# Patient Record
Sex: Female | Born: 1995 | Hispanic: No | Marital: Single | Smoking: Former smoker
Health system: Southern US, Community
[De-identification: ages and names within clinical notes are randomized; demographics above are authoritative.]

## PROBLEM LIST (undated history)

## (undated) DIAGNOSIS — IMO0002 Reserved for concepts with insufficient information to code with codable children: Secondary | ICD-10-CM

## (undated) DIAGNOSIS — F3181 Bipolar II disorder: Secondary | ICD-10-CM

## (undated) DIAGNOSIS — F4310 Post-traumatic stress disorder, unspecified: Secondary | ICD-10-CM

## (undated) DIAGNOSIS — M329 Systemic lupus erythematosus, unspecified: Secondary | ICD-10-CM

## (undated) HISTORY — PX: SKIN BIOPSY: SHX1

---
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
## 2016-01-01 DIAGNOSIS — F3181 Bipolar II disorder: Principal | ICD-10-CM

## 2016-01-01 DIAGNOSIS — Z79899 Other long term (current) drug therapy: Secondary | ICD-10-CM

## 2016-01-01 MED ORDER — ENSURE ENLIVE PO LIQD
237.0000 mL | Freq: Two times a day (BID) | ORAL | Status: DC
  Administered 2016-01-01 – 2016-01-02 (×2): 237 mL via ORAL

## 2016-01-01 NOTE — BHH Suicide Risk Assessment (Addendum)
[facility]   Nursing information obtained from:  Patient Demographic factors:  Adolescent or young adult Current Mental Status:  NA Loss Factors:  NA Historical Factors:  Prior suicide attempts, Impulsivity Risk Reduction Factors:  NA  Total Time spent with patient: 1 hour Principal Problem: <principal problem not specified> Diagnosis:   Patient Active Problem List   Diagnosis Date Noted  . Major depressive disorder, recurrent severe without psychotic features (HCC) [F33.2] 12/31/2015  . Bipolar 2 disorder (HCC) [F31.81] 12/31/2015   Subjective Data: 20 year old female ,a student of [employer] admitted here for suicide ideation and attempt with a lot of  Lithium prescribed for her Bipolar Disorder. Freshman for 2 weeks but not taking the lithium. 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 She now denies it was a suicide attempt. Patient got very sick after the overdose and called her uncle who took her to the Ed. sHE HAS LOST 20pounds in a month. Urine is positive for THC  Continued Clinical Symptoms:  Alcohol Use Disorder Identification Test Final Score (AUDIT): 0 The "Alcohol Use Disorders Identification Test", Guidelines for Use in Primary Care, Second Edition.  World Science writerHealth Organization [facility](WHO). Score between 0-7:  no or low risk or alcohol related problems. Score between 8-15:  moderate risk of alcohol related problems. Score between 16-19:  high risk of alcohol related problems. Score 20 or above:  warrants further diagnostic evaluation for alcohol dependence and treatment.   CLINICAL FACTORS:   Depression:   Anhedonia   Musculoskeletal: Strength & Muscle Tone: within normal limits Gait & Station: normal Patient leans: N/A  Psychiatric Specialty Exam: Physical Exam  ROS  Blood pressure 114/76, pulse (!) 107, temperature 98.4  F (36.9 C), temperature source Oral, resp. rate 18, height 5\' 2"  (1.575 m), weight 46.3 kg (102 lb), last menstrual period 12/23/2015, SpO2 100 %.Body mass index is 18.66 kg/m.  General Appearance: Casual  Eye Contact:  Good  Speech:  Clear and Coherent and Normal Rate  Volume:  Normal  Mood:  Depressed  Affect:  Appropriate  Thought Process:  Coherent  Orientation:  Full (Time, Place, and Person)  Thought Content:  Logical  Suicidal Thoughts:  No  Homicidal Thoughts:  No  Memory:  Immediate;   Good Recent;   Good Remote;   Good  Judgement:  Impaired  Insight:  Lacking  Psychomotor Activity:  Normal  Concentration:  Concentration: Fair and Attention Span: Fair  Recall:  Good  Fund of Knowledge:  Good  Language:  Good  Akathisia:  No  Handed:  Right  AIMS (if indicated):     Assets:  Communication Skills Desire for Improvement Housing Social Support Vocational/Educational  ADL's:  Intact  Cognition:  WNL  Sleep:  Number of Hours: 5.25      COGNITIVE FEATURES THAT CONTRIBUTE TO RISK:  None    SUICIDE RISK:   Moderate:  Frequent suicidal ideation with limited intensity, and duration, some specificity in terms of plans, no associated intent, good self-control, limited dysphoria/symptomatology, some risk factors present, and identifiable protective factors, including available and accessible social support.   PLAN OF CARE: Obtain collateral information from Family member for treatment and transition palnning. Restart trileptal. Encourage patient to participate in milieu, individual and group therapies.  I certify that inpatient services furnished can reasonably be expected to improve the  patient's condition.  Wynelle Bourgeois, MD 01/01/2016, 1:30 PM

## 2016-01-01 NOTE — Plan of Care (Signed)
Problem: Coping: Goal: Ability to verbalize frustrations and anger appropriately will improve Outcome: Not Progressing Pt. states "I don't want to be here".

## 2016-01-01 NOTE — H&P (Signed)
Psychiatric Admission Assessment Adult  Patient Identification: Michelle Cordova MRN:  [id number] Date of Evaluation:  01/01/2016 Chief Complaint:  bipolar 2 ptsd Principal Diagnosis: <principal problem not specified> Diagnosis:   Patient Active Problem List   Diagnosis Date Noted  . Major depressive disorder, recurrent severe without psychotic features ([place]) [F33.2] 12/31/2015  . Bipolar 2 disorder [facility]) [F31.81] 12/31/2015   History of Present Illness:  Michelle Cordova is an 20 y.o. female that presents this date transported by [employer] after having some thoughts of self harm earlier this date with no plan. Patient stated she is "so overwhelmed with everything".  She states she was diagnosed with Bipolar disorder 2 years ago, prescribed Lithium, but she was non compliant.  She states that she used to be on Trileptal but it gave her skin breakouts and she stopped.   Patient states she has problems "with everything" and became so distraught this date she had thoughts of harming herself.  She went into [facility] thinking she was going to get meds and now she is here, against her will and she is demanding to be discharged,.  Her aunt is in the room and can vouch that she has not been taking meds but that she can be released to her care as she will take her to her outpatient psychiatrist in [place].  Discussed that monitoring would be necessary to monitor her safety and she will not be released today.    Patient admits to two prior attempts at self harm with the last one four months ago when patient reported attempting to overdose on her lithium. Patient was admitted for four days at [facility] in [place] and released denying follow up aftercare and discontinued her lithium regimen after two months from discharge.   Patient reports ongoing Cannabis use stating she smokes "once or twice a week but cannot recall her last use. Patient denies any other use of illicit substances. Patient is  time/place oriented and is pleasant as she interacted with this Probation officer. Patient is open to a voluntary admission and desires to restart medications although stated she does not want "lithium." Patient does have a history of past trauma but declines to discuss that incident.  She is a Ship broker at [employer].  Associated Signs/Symptoms: Depression Symptoms:  depressed mood, anxiety, (Hypo) Manic Symptoms:  Impulsivity, Irritable Mood, Labiality of Mood, Anxiety Symptoms:  Excessive Worry, Psychotic Symptoms:  NA PTSD Symptoms: NA Total Time spent with patient: 30 minutes  Past Psychiatric History: see HPI  Is the patient at risk to self? Yes.    Has the patient been a risk to self in the past 6 months? Yes.    Has the patient been a risk to self within the distant past? Yes.    Is the patient a risk to others? No.  Has the patient been a risk to others in the past 6 months? No.  Has the patient been a risk to others within the distant past? No.   Prior Inpatient Therapy:   Prior Outpatient Therapy:    Alcohol Screening: 1. How often do you have a drink containing alcohol?: Never 9. Have you or someone else been injured as a result of your drinking?: No 10. Has a relative or friend or a doctor or another health worker been concerned about your drinking or suggested you cut down?: No Alcohol Use Disorder Identification Test Final Score (AUDIT): 0 Brief Intervention: AUDIT score less than 7 or less-screening does not suggest unhealthy drinking-brief intervention  not indicated Substance Abuse History in the last 12 months:  Yes.   Consequences of Substance Abuse: crisis management Previous Psychotropic Medications: Yes  Psychological Evaluations: Yes  Past Medical History:  Past Medical History:  Diagnosis Date  . Bipolar 2 disorder (HCC)   . Lupus (HCC)   . PTSD (post-traumatic stress disorder)    History reviewed. No pertinent surgical history. Family History: History reviewed. No  pertinent family history. Family Psychiatric  History: see HPI Tobacco Screening: Have you used any form of tobacco in the last 30 days? (Cigarettes, Smokeless Tobacco, Cigars, and/or Pipes): No Social History:  History  Alcohol Use  . Yes    Comment: rarely but socially     History  Drug Use  . Types: Marijuana    Comment: occasionally    Additional Social History:      Pain Medications: See MAR Prescriptions: See MAR Over the Counter: See MAR History of alcohol / drug use?: Yes Longest period of sobriety (when/how long): pt can not recall Negative Consequences of Use: Work / School Withdrawal Symptoms: Other (Comment) (denies withdrawal symptoms) Name of Substance 1: Cannabis 1 - Age of First Use: 17 1 - Amount (size/oz): 1 gram 1 - Frequency: once or twice a week 1 - Duration: Last two years 1 - Last Use / Amount: pt cannot remember                  Allergies:  No Known Allergies Lab Results:  Results for orders placed or performed during the hospital encounter of 12/31/15 (from the past 48 hour(s))  Ethanol     Status: None   Collection Time: 12/31/15  1:48 PM  Result Value Ref Range   Alcohol, Ethyl (B) <5 <5 mg/dL    Comment:        LOWEST DETECTABLE LIMIT FOR SERUM ALCOHOL IS 5 mg/dL FOR MEDICAL PURPOSES ONLY   Salicylate level     Status: None   Collection Time: 12/31/15  1:48 PM  Result Value Ref Range   Salicylate Lvl <4.0 2.8 - 30.0 mg/dL  Acetaminophen level     Status: Abnormal   Collection Time: 12/31/15  1:48 PM  Result Value Ref Range   Acetaminophen (Tylenol), Serum <10 (L) 10 - 30 ug/mL    Comment:        THERAPEUTIC CONCENTRATIONS VARY SIGNIFICANTLY. A RANGE OF 10-30 ug/mL MAY BE AN EFFECTIVE CONCENTRATION FOR MANY PATIENTS. HOWEVER, SOME ARE BEST TREATED AT CONCENTRATIONS OUTSIDE THIS RANGE. ACETAMINOPHEN CONCENTRATIONS >150 ug/mL AT 4 HOURS AFTER INGESTION AND >50 ug/mL AT 12 HOURS AFTER INGESTION ARE OFTEN ASSOCIATED WITH  TOXIC REACTIONS.   Comprehensive metabolic panel     Status: Abnormal   Collection Time: 12/31/15  1:51 PM  Result Value Ref Range   Sodium 137 135 - 145 mmol/L   Potassium 3.7 3.5 - 5.1 mmol/L   Chloride 106 101 - 111 mmol/L   CO2 25 22 - 32 mmol/L   Glucose, Bld 74 65 - 99 mg/dL   BUN 10 6 - 20 mg/dL   Creatinine, Ser 1.78 0.44 - 1.00 mg/dL   Calcium 9.3 8.9 - 67.7 mg/dL   Total Protein 7.4 6.5 - 8.1 g/dL   Albumin 4.4 3.5 - 5.0 g/dL   AST 18 15 - 41 U/L   ALT 22 14 - 54 U/L   Alkaline Phosphatase 39 38 - 126 U/L   Total Bilirubin 0.2 (L) 0.3 - 1.2 mg/dL   GFR calc non  Af Amer >60 >60 mL/min   GFR calc Af Amer >60 >60 mL/min    Comment: (NOTE) The eGFR has been calculated using the CKD EPI equation. This calculation has not been validated in all clinical situations. eGFR's persistently <60 mL/min signify possible Chronic Kidney Disease.    Anion gap 6 5 - 15  cbc     Status: Abnormal   Collection Time: 12/31/15  1:51 PM  Result Value Ref Range   WBC 5.9 4.0 - 10.5 K/uL   RBC 5.21 (H) 3.87 - 5.11 MIL/uL   Hemoglobin 13.6 12.0 - 15.0 g/dL   HCT 42.2 36.0 - 46.0 %   MCV 81.0 78.0 - 100.0 fL   MCH 26.1 26.0 - 34.0 pg   MCHC 32.2 30.0 - 36.0 g/dL   RDW 16.4 (H) 11.5 - 15.5 %   Platelets 316 150 - 400 K/uL  I-Stat beta hCG blood, ED     Status: None   Collection Time: 12/31/15  1:57 PM  Result Value Ref Range   I-stat hCG, quantitative <5.0 <5 mIU/mL   Comment 3            Comment:   GEST. AGE      CONC.  (mIU/mL)   <=1 WEEK        5 - 50     2 WEEKS       50 - 500     3 WEEKS       100 - 10,000     4 WEEKS     1,000 - 30,000        FEMALE AND NON-PREGNANT FEMALE:     LESS THAN 5 mIU/mL   Rapid urine drug screen (hospital performed)     Status: Abnormal   Collection Time: 12/31/15  2:34 PM  Result Value Ref Range   Opiates NONE DETECTED NONE DETECTED   Cocaine NONE DETECTED NONE DETECTED   Benzodiazepines NONE DETECTED NONE DETECTED   Amphetamines NONE DETECTED  NONE DETECTED   Tetrahydrocannabinol POSITIVE (A) NONE DETECTED   Barbiturates NONE DETECTED NONE DETECTED    Comment:        DRUG SCREEN FOR MEDICAL PURPOSES ONLY.  IF CONFIRMATION IS NEEDED FOR ANY PURPOSE, NOTIFY LAB WITHIN 5 DAYS.        LOWEST DETECTABLE LIMITS FOR URINE DRUG SCREEN Drug Class       Cutoff (ng/mL) Amphetamine      1000 Barbiturate      200 Benzodiazepine   790 Tricyclics       383 Opiates          300 Cocaine          300 THC              50     Blood Alcohol level:  Lab Results  Component Value Date   ETH <5 33/83/2919    Metabolic Disorder Labs:  No results found for: HGBA1C, MPG No results found for: PROLACTIN No results found for: CHOL, TRIG, HDL, CHOLHDL, VLDL, LDLCALC  Current Medications: Current Facility-Administered Medications  Medication Dose Route Frequency Provider Last Rate Last Dose  . acetaminophen (TYLENOL) tablet 650 mg  650 mg Oral Q6H PRN Laverle Hobby, PA-C      . alum & mag hydroxide-simeth (MAALOX/MYLANTA) 200-200-20 MG/5ML suspension 30 mL  30 mL Oral Q4H PRN Laverle Hobby, PA-C      . feeding supplement (ENSURE ENLIVE) (ENSURE ENLIVE) liquid 237 mL  237 mL  Oral BID BM Myer Peer Cobos, MD   237 mL at 01/01/16 0815  . hydrOXYzine (ATARAX/VISTARIL) tablet 25 mg  25 mg Oral Q6H PRN Laverle Hobby, PA-C      . risperiDONE (RISPERDAL M-TABS) disintegrating tablet 2 mg  2 mg Oral Q8H PRN Laverle Hobby, PA-C       And  . LORazepam (ATIVAN) tablet 1 mg  1 mg Oral PRN Laverle Hobby, PA-C       And  . ziprasidone (GEODON) injection 20 mg  20 mg Intramuscular PRN Laverle Hobby, PA-C      . magnesium hydroxide (MILK OF MAGNESIA) suspension 30 mL  30 mL Oral Daily PRN Laverle Hobby, PA-C      . traZODone (DESYREL) tablet 50 mg  50 mg Oral QHS,MR X 1 Spencer E Simon, PA-C       PTA Medications: Prescriptions Prior to Admission  Medication Sig Dispense Refill Last Dose  . lactose free nutrition (BOOST) LIQD Take 237 mLs  by mouth 2 (two) times daily.   12/31/2015 at Unknown time  . predniSONE (STERAPRED UNI-PAK 21 TAB) 10 MG (21) TBPK tablet See admin instructions.  0     Musculoskeletal: Strength & Muscle Tone: within normal limits Gait & Station: normal Patient leans: N/A  Psychiatric Specialty Exam: Physical Exam  Vitals reviewed. Psychiatric: Her affect is angry, blunt and labile. She is agitated. Thought content is not paranoid. She expresses impulsivity and inappropriate judgment. She expresses no homicidal and no suicidal ideation.    Review of Systems  Constitutional: Negative.   HENT: Negative.   Eyes: Negative.   Respiratory: Negative.   Cardiovascular: Negative.   Gastrointestinal: Negative.   Genitourinary: Negative.   Musculoskeletal: Negative.   Skin: Negative.   Neurological: Negative.   Endo/Heme/Allergies: Negative.   Psychiatric/Behavioral: The patient is nervous/anxious.     Blood pressure 114/76, pulse (!) 107, temperature 98.4 F (36.9 C), temperature source Oral, resp. rate 18, height _0  (1.575 m), weight 46.3 kg (102 lb), last menstrual period 12/23/2015, SpO2 100 %.Body mass index is 18.66 kg/m.  General Appearance: Neat  Eye Contact:  Fair  Speech:  Pressured  Volume:  Normal  Mood:  Angry, Anxious and Irritable  Affect:  Appropriate, Blunt and Labile  Thought Process:  Coherent  Orientation:  Full (Time, Place, and Person)  Thought Content:  Rumination  Suicidal Thoughts:  No  Homicidal Thoughts:  No  Memory:  Immediate;   Poor Recent;   Poor Remote;   Poor  Judgement:  Poor  Insight:  Lacking  Psychomotor Activity:  Normal  Concentration:  Concentration: Good and Attention Span: Good  Recall:  Good  Fund of Knowledge:  Good  Language:  Good  Akathisia:  Negative  Handed:  Right  AIMS (if indicated):     Assets:  Communication Skills Social Support  ADL's:  Intact  Cognition:  WNL  Sleep:  Number of Hours: 5.25   Treatment Plan Summary: Admit  for crisis management and mood stabilization. Medication management to re-stabilize current mood symptoms Group counseling sessions for coping skills Medical consults as needed Review and reinstate any pertinent home medications for other health problems  Observation Level/Precautions:  15 minute checks  Laboratory:  per ED  Psychotherapy:  group  Medications:  As per medlist  Consultations:  As needed  Discharge Concerns:  safety  Estimated LOS:  2- 7 days  Other:     I certify that inpatient services  furnished can reasonably be expected to improve the patient's condition.    [facility], NP [facility] 8/29/20174:00 PM
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
## 2016-01-01 NOTE — Plan of Care (Signed)
Problem: Activity: Goal: Interest or engagement in activities will improve Outcome: Not Progressing Patient is unwilling to participate in any group activities.  She remains isolative to room.

## 2016-01-01 NOTE — Plan of Care (Signed)
Problem: Medication: Goal: Compliance with prescribed medication regimen will improve Outcome: Not Progressing Pt is not open to taking medications at this time.  She [place] go [place] to [place] to consult about starting any new meds.

## 2016-01-01 NOTE — Progress Notes (Signed)
At the beginning of the shift, pt was visiting with her aunt from [place] who is her main support.  Writer spoke to the aunt who asked about pt's discharge.  Pt's aunt was informed that she would need to call in the morning after 9 am to speak with the pt's CSW regarding pt's discharge.  Pt continues to deny SI/HI/AVH.  Pt's aunt states that she has been through situations similar to this with her niece, and says that her niece is not suicidal at this time.  She says that she intends to take her niece back to [place] when she is discharged.  Pt has been pleasant and polite this evening.  She was given an Ensure per request this evening as she says hasn't been eating much of the hospital food.  Support and encouragement offered.  Discharge plans are in process.  Meds offered as ordered.   Pt refusing to take any meds.  Wants to consult her psychiatrist in [place] before starting any new meds.  Safety maintained with q15 minute checks.

## 2016-01-01 NOTE — Progress Notes (Signed)
Adult Psychoeducational Group Note  Date:  01/01/2016 Time:  9:50 PM  Group Topic/Focus:  Wrap-Up Group:   The focus of this group is to help patients review their daily goal of treatment and discuss progress on daily workbooks.   Participation Level:  Did Not Attend  Participation Quality:  Did not attend  Affect:  Did not attend  Cognitive:  Did not attend  Insight: None  Engagement in Group:  Did not attend  Modes of Intervention:  Did not attend  Additional Comments:  Patient did not attend wrap up group tonight.  Malani Lees L Kay Ricciuti 01/01/2016, 9:50 PM

## 2016-01-01 NOTE — Progress Notes (Signed)
Recreation Therapy Notes  Animal-Assisted Activity (AAA) Program Checklist/Progress Notes Patient Eligibility Criteria Checklist & Daily Group note for Rec TxIntervention  Date: 08.29.2017 Time: 2:45pm [address]: [address]   AAA/T Program Assumption of Risk Form signed by Patient/ or Parent Legal Guardian Yes  Patient is free of allergies or sever asthma Yes  Patient reports no fear of animals Yes  Patient reports no history of cruelty to animals Yes  Patient understands his/her participation is voluntary Yes  Behavioral Response: Did not attend.   Marykay Lexenise L Brita Jurgensen, LRT/CTRS  Laraina Sulton L 01/01/2016 3:04 PM

## 2016-01-01 NOTE — Progress Notes (Signed)
Patient ID: Michelle Cordova, female   DOB: [birth year], 20 y.o.   MRN: [id number] D: Patient is anxious and agitated.  She came up to medication window and stated, "I've got more important things to do.  I need to be out living my life."  Attempted to talk to patient about focusing on her treatment and she stated, "what if I just go around and pull all the fire levers, will the doors open?"  She then proceeded to ask for DSS's telephone number which was given. She stated, "I'm not talking about this DSS.  I need the one in [place].  Is Medicaid the same thing as social security?"  Patient refused to take anything for anxiety. She is focused on seeing the psychiatrist.  She denies any depressive symptoms and rates her anxiety as a 5. A: Continue to monitor medication management and MD orders.  Safety checks completed every 15 minutes per protocol.  Offer support and encouragement as needed. R: Patient is receptive to staff; her behavior is appropriate.

## 2016-01-01 NOTE — Tx Team (Signed)
Interdisciplinary Treatment and Diagnostic Plan Update  01/01/2016 Time of Session: 8:56 AM  Michelle Cordova MRN: [id number]  Principal Diagnosis: <principal problem not specified>  Secondary Diagnoses: Active Problems:   Bipolar 2 disorder (HCC)   Current Medications:  Current Facility-Administered Medications  Medication Dose Route Frequency Provider Last Rate Last Dose  . acetaminophen (TYLENOL) tablet 650 mg  650 mg Oral Q6H PRN Kerry HoughSpencer E Simon, PA-C      . alum & mag hydroxide-simeth (MAALOX/MYLANTA) 200-200-20 MG/5ML suspension 30 mL  30 mL Oral Q4H PRN Kerry HoughSpencer E Simon, PA-C      . feeding supplement (ENSURE ENLIVE) (ENSURE ENLIVE) liquid 237 mL  237 mL Oral BID BM Rockey SituFernando A Cobos, MD   237 mL at 01/01/16 0815  . hydrOXYzine (ATARAX/VISTARIL) tablet 25 mg  25 mg Oral Q6H PRN Kerry HoughSpencer E Simon, PA-C      . risperiDONE (RISPERDAL M-TABS) disintegrating tablet 2 mg  2 mg Oral Q8H PRN Kerry HoughSpencer E Simon, PA-C       And  . LORazepam (ATIVAN) tablet 1 mg  1 mg Oral PRN Kerry HoughSpencer E Simon, PA-C       And  . ziprasidone (GEODON) injection 20 mg  20 mg Intramuscular PRN Kerry HoughSpencer E Simon, PA-C      . magnesium hydroxide (MILK OF MAGNESIA) suspension 30 mL  30 mL Oral Daily PRN Kerry HoughSpencer E Simon, PA-C      . traZODone (DESYREL) tablet 50 mg  50 mg Oral QHS,MR X 1 Spencer E Simon, PA-C        PTA Medications: Prescriptions Prior to Admission  Medication Sig Dispense Refill Last Dose  . lactose free nutrition (BOOST) LIQD Take 237 mLs by mouth 2 (two) times daily.   12/31/2015 at Unknown time  . predniSONE (STERAPRED UNI-PAK 21 TAB) 10 MG (21) TBPK tablet See admin instructions.  0     Treatment Modalities: Medication Management, Group therapy, Case management,  1 to 1 session with clinician, Psychoeducation, Recreational therapy.   Physician Treatment Plan for Primary Diagnosis: <principal problem not specified> Long Term Goal(s): Improvement in symptoms so as ready for discharge  Short  Term Goals: Ability to disclose and discuss suicidal ideas, Ability to identify and develop effective coping behaviors will improve and Compliance with prescribed medications will improve  Medication Management: Evaluate patient's response, side effects, and tolerance of medication regimen.  Therapeutic Interventions: 1 to 1 sessions, Unit Group sessions and Medication administration.  Evaluation of Outcomes: Adequate for Discharge  Physician Treatment Plan for Secondary Diagnosis: Active Problems:   Bipolar 2 disorder (HCC)   Long Term Goal(s): Improvement in symptoms so as ready for discharge  Short Term Goals: Ability to disclose and discuss suicidal ideas, Ability to identify and develop effective coping behaviors will improve and Ability to identify triggers associated with substance abuse/mental health issues will improve  Medication Management: Evaluate patient's response, side effects, and tolerance of medication regimen.  Therapeutic Interventions: 1 to 1 sessions, Unit Group sessions and Medication administration.  Evaluation of Outcomes: Adequate for Discharge   RN Treatment Plan for Primary Diagnosis: <principal problem not specified> Long Term Goal(s): Knowledge of disease and therapeutic regimen to maintain health will improve  Short Term Goals: Ability to remain free from injury will improve, Ability to disclose and discuss suicidal ideas and Ability to identify and develop effective coping behaviors will improve  Medication Management: RN will administer medications as ordered by provider, will assess and evaluate patient's response and provide education to patient  for prescribed medication. RN will report any adverse and/or side effects to prescribing provider.  Therapeutic Interventions: 1 on 1 counseling sessions, Psychoeducation, Medication administration, Evaluate responses to treatment, Monitor vital signs and CBGs as ordered, Perform/monitor CIWA, COWS, AIMS and  Fall Risk screenings as ordered, Perform wound care treatments as ordered.  Evaluation of Outcomes: Adequate for Discharge   LCSW Treatment Plan for Primary Diagnosis: <principal problem not specified> Long Term Goal(s): Safe transition to appropriate next level of care at discharge, Engage patient in therapeutic group addressing interpersonal concerns.  Short Term Goals: Engage patient in aftercare planning with referrals and resources, Increase ability to appropriately verbalize feelings, Facilitate acceptance of mental health diagnosis and concerns and Increase skills for wellness and recovery  Therapeutic Interventions: Assess for all discharge needs, 1 to 1 time with Social worker, Explore available resources and support systems, Assess for adequacy in community support network, Educate family and significant other(s) on suicide prevention, Complete Psychosocial Assessment, Interpersonal group therapy.  Evaluation of Outcomes: Adequate for Discharge   Progress in Treatment: Attending groups: No Participating in groups: No Taking medication as prescribed: Yes, MD continues to assess for medication changes as needed Toleration medication: Yes, no side effects reported at this time Family/Significant other contact made: Yes with aunt Patient understands diagnosis: Limited insight AEB minimizing suicide attempt Discussing patient identified problems/goals with staff: Yes Medical problems stabilized or resolved: Yes Denies suicidal/homicidal ideation: Yes Issues/concerns per patient self-inventory: None Other: N/A  New problem(s) identified: None identified at this time.   New Short Term/Long Term Goal(s): None identified at this time.   Discharge Plan or Barriers: CSW will assess for appropriate discharge plan and relevant barriers.   Reason for Continuation of Hospitalization: Anxiety Depression Medication stabilization Suicidal ideation  Estimated Length of Stay: 0  days  Attendees: Patient: 01/01/2016  8:56 AM  Physician: Dr. Seth Bake 01/01/2016  8:56 AM  Nursing: Marzetta Board, RN; Liborio Nixon, RN 01/01/2016  8:56 AM  RN Care Manager: Onnie Boer, RN 01/01/2016  8:56 AM  Social Worker: Chad Cordial, LCSW; Belenda Cruise Drinkard, LCSW 01/01/2016  8:56 AM  Recreational Therapist:  01/01/2016  8:56 AM  Other: Armandina Stammer, NP; Gray Bernhardt, NP 01/01/2016  8:56 AM  Other:  01/01/2016  8:56 AM  Other: 01/01/2016  8:56 AM    Scribe for Treatment Team: Elaina Hoops, LCSW 01/01/2016 8:56 AM

## 2016-01-01 NOTE — Progress Notes (Signed)
D: Pt. couldn't state a goal except for " I don't want to be here".  Pt. is up and visible in the Mil[address] hallway. Denies having any SI/HI and AVH at this time. Rates pain as 0/10. Pt. is irritable and responds in short phrases, sometimes just yes or no.   A:Encouragement and support given. Pt. is educated on the importance of ordered Boost supplement and Trazodone but pt. refuses after multiple attempts.   R: Safety maintained with Q 15 checks. Continues to follow treatment plan and will monitor closely.    Signed by: Tyrone AppleEmily Tula Schryver, RN

## 2016-01-01 NOTE — Progress Notes (Signed)
Patient ID: Michelle Cordova, female   DOB: 1996/01/06, 20 y.o.   MRN: [id number] Patient becoming increasingly agitated and anxious.  She refuses to take medication.  Patient took one of the heavy chairs from the hallway and moved it to her room.  Patient placed the chair in front of her window.  [facility] and [facility] went to patient's room to talk with her. Patient made several comments that "I might as well just quit school.  I'm going to be here.  Can ya'll give me something to make me sleep for several days?  Can I go somewhere for a year?"  Patient indicated that she would refuse any medication that was ordered for her.  She talked of her stressors at school and having a diagnosis of lupus.  She can't comprehend why she is here.  She signed at 72 hour discharge at 1400.  Patient's aunt arrived during conversation to pick patient up.  Patient had called aunt in [place] and she drove down to pick her up.  Patient's aunt was brought on the unit to talk with patient and attempt to calm her down.  Aunt was informed that patient would not be leaving today.  NP also came to her room, along with Child psychotherapistsocial worker.  Patient has poor insight into her bipolar diagnosis and why she needs to be here.  She has threatened to "act out" and "you will have to put me down."  Requested that aunt leave since it was not therapeutic to patient.  Patient is labile and agitated at this time.  Continue to observe for escalating behavior.

## 2016-01-01 NOTE — [facility] Group Notes (Signed)
[facility] LCSW Group Therapy 01/01/2016 1:15 PM  Type of Therapy: Group Therapy- Feelings about Diagnosis  Pt did not attend, declined invitation.   Chad CordialLauren Carter, LCSWA 01/01/2016 5:02 PM

## 2016-01-01 NOTE — [facility] Group Notes (Signed)
[facility] Group Notes:  (Nursing/MHT/Case Management/Adjunct)  Date:  01/01/2016  Time:  0900 am  Type of Therapy:  Psychoeducational Skills  Participation Level:  Did Not Attend  Patient invited; declined to attend.  Cranford MonBeaudry, Archit Leger Evans 01/01/2016, 10:26 AM

## 2016-01-01 NOTE — Progress Notes (Signed)
NUTRITION ASSESSMENT  Pt identified as at risk on the Malnutrition Screen Tool  INTERVENTION: 1. Educated patient on the importance of nutrition and encouraged intake of food and beverages. 2. Discussed weight goals. 3. Supplements: Ensure Enlive po BID, each supplement provides 350 kcal and 20 grams of protein   NUTRITION DIAGNOSIS: Unintentional weight loss related to sub-optimal intake as evidenced by pt report.   Goal: Pt to meet >/= 90% of their estimated nutrition needs.  Monitor:  PO intake  Assessment:  Michelle Cordova is a  20 y.o. female who came to ED via [employer] police after having thoughts of self-harm. Has hx of 2 prior attempts, most recent of which was 4 months ago. Uses marijuana 1-2x/week. Hx of Lupus.   Height: Ht Readings from Last 1 Encounters:  12/31/15 5\' 2"  (1.575 m) (19 %, Z= -0.89)*   * Growth percentiles are based on CDC 2-20 Years data.    Weight: Wt Readings from Last 1 Encounters:  12/31/15 102 lb (46.3 kg) (6 %, Z= -1.58)*   * Growth percentiles are based on CDC 2-20 Years data.    Weight Hx: Wt Readings from Last 10 Encounters:  12/31/15 102 lb (46.3 kg) (6 %, Z= -1.58)*  12/31/15 103 lb (46.7 kg) (7 %, Z= -1.49)*   * Growth percentiles are based on CDC 2-20 Years data.    BMI:  Body mass index is 18.66 kg/m. Pt meets criteria for normal based on current BMI.  Estimated Nutritional Needs: Kcal: 25-30 kcal/kg Protein: > 1 gram protein/kg Fluid: 1 ml/kcal  Diet Order: Diet regular Room service appropriate? Yes; Fluid consistency: Thin Pt is also offered choice of unit snacks mid-morning and mid-afternoon.  Pt is eating as desired.   Lab results and medications reviewed.   Michelle AnoWilliam M. Johnathon Olden, MS, RD LDN Inpatient Clinical Dietitian Pager [phone]

## 2016-01-02 MED ORDER — HYDROXYZINE HCL 25 MG PO TABS: 25.0000 mg | ORAL_TABLET | Freq: Four times a day (QID) | ORAL | 0 refills | Status: DC | PRN

## 2016-01-02 MED ORDER — OXCARBAZEPINE 150 MG PO TABS: 150.0000 mg | ORAL_TABLET | Freq: Two times a day (BID) | ORAL | 0 refills | Status: DC

## 2016-01-02 MED ORDER — TRAZODONE HCL 50 MG PO TABS: 50.0000 mg | ORAL_TABLET | Freq: Every evening | ORAL | 0 refills | Status: DC | PRN

## 2016-01-02 MED ORDER — OXCARBAZEPINE 150 MG PO TABS
150.0000 mg | ORAL_TABLET | Freq: Two times a day (BID) | ORAL | Status: DC
  Administered 2016-01-02: 150 mg via ORAL
  Filled 2016-01-02 (×5): qty 1

## 2016-01-02 NOTE — Progress Notes (Signed)
Recreation Therapy Notes  Date: 01/02/16 Time: 0930 Location: [address] Dayroom  Group Topic: Stress Management  Goal Area(s) Addresses:  Patient will verbalize importance of using healthy stress management.  Patient will identify positive emotions associated with healthy stress management.   Intervention: Stress Management   Activity :  Progressive Muscle Relaxation.  LRT introduced to technique of progressive muscle relaxation to patients.  LRT read a script to guide the patients through the exercise.  Patients were to follow along as LRT read the script to engage in the activity.  Education:  Stress Management, Discharge Planning.   Education Outcome: Needs additional education  Clinical Observations/Feedback:  Pt did not attend group.    Caroll RancherMarjette Ollie Esty, LRT/CTRS         Caroll RancherLindsay, Sigmond Patalano A 01/02/2016 3:07 PM

## 2016-01-02 NOTE — [facility] Suicide Risk Assessment (Signed)
[facility]   Principal Problem: <principal problem not specified> Discharge Diagnoses:  Patient Active Problem List   Diagnosis Date Noted  . Major depressive disorder, recurrent severe without psychotic features (HCC) [F33.2] 12/31/2015  . Bipolar 2 disorder (HCC) [F31.81] 12/31/2015    Total Time spent with patient: 30 minutes  Musculoskeletal: Strength & Muscle Tone: within normal limits Gait & Station: normal Patient leans: Right  Psychiatric Specialty Exam: ROS  Blood pressure 118/71, pulse 85, temperature 98.8 F (37.1 C), temperature source Oral, resp. rate 16, height 5\' 2"  (1.575 m), weight 46.3 kg (102 lb), last menstrual period 12/23/2015, SpO2 100 %.Body mass index is 18.66 kg/m.  General Appearance: Casual  Eye Contact::  Good  Speech:  Clear and Coherent and Normal Rate409  Volume:  Normal  Mood:  Euthymic  Affect:  Congruent  Thought Process:  Coherent and Goal Directed  Orientation:  Full (Time, Place, and Person)  Thought Content:  Logical  Suicidal Thoughts:  No  Homicidal Thoughts:  No  Memory:  Immediate;   Good Recent;   Good Remote;   Good  Judgement:  Fair  Insight:  Fair  Psychomotor Activity:  Normal  Concentration:  Good  Recall:  Good  Fund of Knowledge:Good  Language: Good  Akathisia:  No  Handed:  Right  AIMS (if indicated):     Assets:  Communication Skills Desire for Improvement Financial Resources/Insurance Housing Physical Health Social Support Transportation Vocational/Educational  Sleep:  Number of Hours: 6.25  Cognition: WNL  ADL's:  Intact   Mental Status Per Nursing Assessment::   On Admission:  NA  Demographic Factors:  Adolescent or young adult  Loss Factors: NA  Historical Factors: Prior suicide attempts and Impulsivity  Risk Reduction Factors:   Religious beliefs about death, Living with another person, especially a relative and Positive social support  Continued Clinical  Symptoms:  None  Cognitive Features That Contribute To Risk:  None    Suicide Risk:  Minimal: No identifiable suicidal ideation.  Patients presenting with no risk factors but with morbid ruminations; may be classified as minimal risk based on the severity of the depressive symptoms  Follow-up Information    [facility] .   Why:  Please use walk in hours to establish for counseling services w the Counseling Center.  Hours are 9 - 5 Monday - Friday.  Bring hospital discharge paperwork to the Counseling Center.   Contact information: [employer] [employer]  [address] [place] [address] (T) [phone] (F) [phone]       [facility]17/2016.   Why:  Medications management on 9/14 at 8:40 AM w Oneida Arenasussell Lechiejewki, NP.  Please call to cancel/reschedule if needed.  Bring hospital discharge paperwork to this appointment.  Contact information: [address] [place] [address] Phone:  [phone] Fax:  [phone]           Plan Of Care/Follow-up recommendations:  Activity:  Patient [facility] a [facility] appointment [facility]. Spoke with Aunt who is very supportive. Patient will continue trileptal  Wynelle BourgeoisUreh N Lekauwa, MD 01/02/2016, 11:26 AM

## 2016-01-02 NOTE — Discharge Summary (Signed)
Physician Discharge Summary Note  Patient:  Michelle Cordova is an 20 y.o., female MRN:  [id number] DOB:  1995/06/23 Patient phone:  [phone] (home)  Patient address:   [address] [place] [address],  Total Time spent with patient: 30 minutes  Date of Admission:  12/31/2015 Date of Discharge: 01/02/2016  Reason for Admission:  Went to the ED for med managment  Principal Problem: Bipolar 2 disorder [facility]) Discharge Diagnoses: Patient Active Problem List   Diagnosis Date Noted  . Bipolar 2 disorder (HCC) [F31.81] 12/31/2015    Priority: High  . Major depressive disorder, recurrent severe without psychotic features (HCC) [F33.2] 12/31/2015    Past Psychiatric History:  See HPI  Past Medical History:  Past Medical History:  Diagnosis Date  . Bipolar 2 disorder (HCC)   . Lupus (HCC)   . PTSD (post-traumatic stress disorder)    History reviewed. No pertinent surgical history. Family History: History reviewed. No pertinent family history. Family Psychiatric  History: see HPI Social History:  History  Alcohol Use  . Yes    Comment: rarely but socially     History  Drug Use  . Types: Marijuana    Comment: occasionally    Social History   Social History  . Marital status: Single    Spouse name: N/A  . Number of children: N/A  . Years of education: N/A   Social History Main Topics  . Smoking status: Never Smoker  . Smokeless tobacco: Never Used  . Alcohol use Yes     Comment: rarely but socially  . Drug use:     Types: Marijuana     Comment: occasionally  . Sexual activity: Yes    Birth control/ protection: Injection   Other Topics Concern  . None   Social History Narrative  . None    Hospital Course:  [name], 20 yo came in to [facility] after she was referred to seek for agitated behavior.  She reports being diagnosed with Bipolar D/O but admit to being non compliant.  She stated that she did not like taking the Lithium and was on  Trileptal but stopped that too as it made her face breakout.  She denied SI and HI, rather she said she needed to get back on her medications.    Michelle Cordova was admitted for Bipolar 2 disorder [facility]) and crisis management.  She was treated with medications with their indications listed below.  Medical problems were identified and treated as needed.  Home medications were restarted as appropriate.  Improvement was monitored by observation and Anne Fu daily report of symptom reduction.  Emotional and mental status was monitored by daily self inventory reports completed by Anne Fu and clinical staff.  Patient reported continued improvement, denied any new concerns.  Patient had been compliant on medications and denied side effects.  Support and encouragement was provided.    Patient encouraged to attend groups to help with recognizing triggers of emotional crises and de-stabilizations.  Patient encouraged to attend group to help identify the positive things in life that would help in dealing with feelings of loss, depression and unhealthy or abusive tendencies.  Discussed the importance of taking meds as prescribed to help control her mood lability and irritability.  Aunt was at bedside when this NP spoke with the patient.         Michelle Cordova was evaluated by the treatment team for stability and plans for continued recovery upon discharge.  She was offered further  treatment options upon discharge including Residential, Intensive Outpatient and Outpatient treatment.  She will follow up with agencies listed below for medication management and counseling.  Encouraged patient to maintain satisfactory support network and home environment.  Advised to adhere to medication compliance and outpatient treatment follow up.  Prescriptions provided.       Anne Furiscilla Venezia motivation was an integral factor for scheduling further treatment.  Employment, transportation, bed availability,  health status, family support, and any pending legal issues were also considered during her hospital stay.  Upon completion of this admission the patient was both mentally and medically stable for discharge denying suicidal/homicidal ideation, auditory/visual/tactile hallucinations, delusional thoughts and paranoia.      Physical Findings: AIMS: Facial and Oral Movements Muscles of Facial Expression: None, normal Lips and Perioral Area: None, normal Jaw: None, normal Tongue: None, normal,Extremity Movements Upper (arms, wrists, hands, fingers): None, normal Lower (legs, knees, ankles, toes): None, normal, Trunk Movements Neck, shoulders, hips: None, normal, Overall Severity Severity of abnormal movements (highest score from questions above): None, normal Incapacitation due to abnormal movements: None, normal Patient's awareness of abnormal movements (rate only patient's report): No Awareness, Dental Status Current problems with teeth and/or dentures?: No Does patient usually wear dentures?: No  CIWA:    COWS:     Musculoskeletal: Strength & Muscle Tone: within normal limits Gait & Station: normal Patient leans: N/A  Psychiatric Specialty Exam:  See MD SRA Physical Exam  ROS  Blood pressure 118/71, pulse 85, temperature 98.8 F (37.1 C), temperature source Oral, resp. rate 16, height 5\' 2"  (1.575 m), weight 46.3 kg (102 lb), last menstrual period 12/23/2015, SpO2 100 %.Body mass index is 18.66 kg/m.   Have you used any form of tobacco in the last 30 days? (Cigarettes, Smokeless Tobacco, Cigars, and/or Pipes): No  Has this patient used any form of tobacco in the last 30 days? (Cigarettes, Smokeless Tobacco, Cigars, and/or Pipes) Yes, N/A  Blood Alcohol level:  Lab Results  Component Value Date   ETH <5 12/31/2015    Metabolic Disorder Labs:  No results found for: HGBA1C, MPG No results found for: PROLACTIN No results found for: CHOL, TRIG, HDL, CHOLHDL, VLDL, LDLCALC  See  Psychiatric Specialty Exam and Suicide Risk Assessment completed by Attending Physician prior to discharge.  Discharge destination:  Home  Is patient on multiple antipsychotic therapies at discharge:  No   Has Patient had three or more failed trials of antipsychotic monotherapy by history:  No  Recommended Plan for Multiple Antipsychotic Therapies: NA     Medication List    STOP taking these medications   lactose free nutrition Liqd   predniSONE 10 MG (21) Tbpk tablet Commonly known as:  STERAPRED UNI-PAK 21 TAB     TAKE these medications     Indication  hydrOXYzine 25 MG tablet Commonly known as:  ATARAX/VISTARIL Take 1 tablet (25 mg total) by mouth every 6 (six) hours as needed for anxiety.  Indication:  Anxiety Neurosis   OXcarbazepine 150 MG tablet Commonly known as:  TRILEPTAL Take 1 tablet (150 mg total) by mouth 2 (two) times daily.  Indication:  mood stabilization   traZODone 50 MG tablet Commonly known as:  DESYREL Take 1 tablet (50 mg total) by mouth at bedtime and may repeat dose one time if needed.  Indication:  Trouble Sleeping      Follow-up Information    [facility] .   Why:  Please use walk in hours  to establish for counseling services w the Counseling Center.  Hours are 9 - 5 Monday - Friday.  Bring hospital discharge paperwork to the Counseling Center.   Contact information: [facility]  [address] [place] [address] (T) [phone] (F) [phone]       [facility] Follow up on 01/17/2016.   Why:  Medications management on 9/14 at 8:40 AM w Oneida Arenas, NP.  Please call to cancel/reschedule if needed.  Bring hospital discharge paperwork to this appointment.  Contact information: [address] [place] [address] Phone:  [phone] Fax:  [phone]           Follow-up recommendations:  Activity:  as tol Diet:  as tol  Comments:  1.  Take all your medications as prescribed.   2.  Report any  adverse side effects to outpatient provider. 3.  Patient instructed to not use alcohol or illegal drugs while on prescription medicines. 4.  In the event of worsening symptoms, instructed patient to call 911, the crisis hotline or go to nearest emergency room for evaluation of symptoms.  Signed: Lindwood Qua, NP [facility] 01/02/2016, 4:03 PM

## 2016-01-02 NOTE — Progress Notes (Signed)
Patient ID: Michelle Cordova, female   DOB: 1995/10/26, 20 y.o.   MRN: [id number]  Pt. Denies SI/HI and A/V hallucinations. Belongings returned to patient at time of discharge. Patient denies any pain or discomfort. Discharge instructions and medications were reviewed with patient. Patient verbalized understanding of both medications and discharge instructions. Patient was given bus passes, written, and verbal directions to bus stop. Patient verbalized understanding of this. Q15 minute safety checks maintained until time of discharge.

## 2016-01-02 NOTE — [facility] Suicide Risk Assessment (Signed)
[facility] INPATIENT:  Family/Significant Other Suicide Prevention Education  Suicide Prevention Education:  Education Completed; Marius DitchJennifer Hill, Pt's aunt [phone],  has been identified by the patient as the family member/significant other with whom the patient will be residing, and identified as the person(s) who will aid the patient in the event of a mental health crisis (suicidal ideations/suicide attempt).  With written consent from the patient, the family member/significant other has been provided the following suicide prevention education, prior to the and/or following the discharge of the patient.  The suicide prevention education provided includes the following:  Suicide risk factors  Suicide prevention and interventions  National Suicide Hotline telephone number  [facility] assessment telephone number  [facility] Emergency Assistance 911  [facility] and/or Residential Mobile Crisis Unit telephone number  Request made of family/significant other to:  Remove weapons (e.g., guns, rifles, knives), all items previously/currently identified as safety concern.    Remove drugs/medications (over-the-counter, prescriptions, illicit drugs), all items previously/currently identified as a safety concern.  The family member/significant other verbalizes understanding of the suicide prevention education information provided.  The family member/significant other agrees to remove the items of safety concern listed above.  Elaina HoopsLauren M Carter 01/02/2016, 11:13 AM

## 2016-01-02 NOTE — Plan of Care (Signed)
Problem: Safety: Goal: Ability to disclose and discuss suicidal ideas will improve Outcome: Not Progressing Pt has not been willing to discuss the episode that brought her into the hospital and is denying that she made any suicidal statements.  She says she was misunderstood by the campus police and feels she does not need to be in the hospital.

## 2016-01-02 NOTE — Progress Notes (Addendum)
  [facility] :  Will you be returning to the same living situation after discharge:  Yes,  pt returning home At discharge, do you have transportation home?: Yes,  Pt provided with bus pass Do you have the ability to pay for your medications: Yes,  Pt provided with prescriptions  Release of information consent forms completed and in the chart;  Patient's signature needed at discharge.  Patient to Follow up at: Follow-up Information    [facility] .   Why:  Please use walk in hours to establish for counseling services w the Counseling Center.  Hours are 9 - 5 Monday - Friday.  Bring hospital discharge paperwork to the Counseling Center.   Contact information: [facility]  [address] [place] [address] (T) [phone] (F) [phone]       [facility] Follow up on 01/17/2016.   Why:  Medications management on 9/14 at 8:40 AM w Oneida Arenasussell Lechiejewki, NP.  Please call to cancel/reschedule if needed.  Bring hospital discharge paperwork to this appointment.  Contact information: [address] [place] [address] Phone:  [phone] Fax:  [phone]           Next level of care provider has access to [facility] Link:no  Safety Planning and Suicide Prevention discussed: Yes,  with aunt; see SPE  Have you used any form of tobacco in the last 30 days? (Cigarettes, Smokeless Tobacco, Cigars, and/or Pipes): No  Has patient been referred to the Quitline?: N/A patient is not a smoker  Patient has been referred for addiction treatment: Yes  Larz Mark Lavonna RuaM Carter 01/02/2016, 11:17 AM

## 2017-01-20 ENCOUNTER — Emergency Department (HOSPITAL_COMMUNITY)
Admission: EM | Admit: 2017-01-20 | Discharge: 2017-01-20 | Disposition: A | Discharge status: Home / Self Care | Payer: Medicaid Other | Attending: Emergency Medicine | Admitting: Emergency Medicine

## 2017-01-20 ENCOUNTER — Encounter (HOSPITAL_COMMUNITY): Payer: Self-pay | Admitting: Emergency Medicine

## 2017-01-20 DIAGNOSIS — Z79899 Other long term (current) drug therapy: Secondary | ICD-10-CM | POA: Diagnosis not present

## 2017-01-20 DIAGNOSIS — G43A0 Cyclical vomiting, not intractable: Secondary | ICD-10-CM | POA: Diagnosis not present

## 2017-01-20 DIAGNOSIS — R1115 Cyclical vomiting syndrome unrelated to migraine: Secondary | ICD-10-CM

## 2017-01-20 DIAGNOSIS — R112 Nausea with vomiting, unspecified: Secondary | ICD-10-CM | POA: Diagnosis present

## 2017-01-20 LAB — CBC
HEMATOCRIT: 43.8 % (ref 36.0–46.0)
HEMOGLOBIN: 15.2 g/dL — AB (ref 12.0–15.0)
MCH: 28.9 pg (ref 26.0–34.0)
MCHC: 34.7 g/dL (ref 30.0–36.0)
MCV: 83.3 fL (ref 78.0–100.0)
Platelets: 262 10*3/uL (ref 150–400)
RBC: 5.26 MIL/uL — ABNORMAL HIGH (ref 3.87–5.11)
RDW: 13.6 % (ref 11.5–15.5)
WBC: 8.8 10*3/uL (ref 4.0–10.5)

## 2017-01-20 LAB — URINALYSIS, ROUTINE W REFLEX MICROSCOPIC
BACTERIA UA: NONE SEEN
BILIRUBIN URINE: NEGATIVE
Glucose, UA: NEGATIVE mg/dL
Ketones, ur: NEGATIVE mg/dL
Leukocytes, UA: NEGATIVE
Nitrite: NEGATIVE
Protein, ur: NEGATIVE mg/dL
SPECIFIC GRAVITY, URINE: 1.011 (ref 1.005–1.030)
pH: 7 (ref 5.0–8.0)

## 2017-01-20 LAB — COMPREHENSIVE METABOLIC PANEL
ALBUMIN: 5.2 g/dL — AB (ref 3.5–5.0)
ALT: 62 U/L — ABNORMAL HIGH (ref 14–54)
ANION GAP: 13 (ref 5–15)
AST: 68 U/L — AB (ref 15–41)
Alkaline Phosphatase: 62 U/L (ref 38–126)
BUN: 21 mg/dL — AB (ref 6–20)
CO2: 18 mmol/L — ABNORMAL LOW (ref 22–32)
Calcium: 10.3 mg/dL (ref 8.9–10.3)
Chloride: 113 mmol/L — ABNORMAL HIGH (ref 101–111)
Creatinine, Ser: 1.3 mg/dL — ABNORMAL HIGH (ref 0.44–1.00)
GFR calc Af Amer: >60 mL/min (ref >60)
GFR calc non Af Amer: 59 mL/min — ABNORMAL LOW (ref >60)
GLUCOSE: 102 mg/dL — AB (ref 65–99)
POTASSIUM: 4.2 mmol/L (ref 3.5–5.1)
SODIUM: 144 mmol/L (ref 135–145)
Total Bilirubin: 1.2 mg/dL (ref 0.3–1.2)
Total Protein: 8.6 g/dL — ABNORMAL HIGH (ref 6.5–8.1)

## 2017-01-20 LAB — LIPASE, BLOOD: Lipase: 23 U/L (ref 11–51)

## 2017-01-20 MED ORDER — ONDANSETRON 4 MG PO TBDP: 4.0000 mg | ORAL_TABLET | Freq: Three times a day (TID) | ORAL | 0 refills | Status: DC | PRN

## 2017-01-20 MED ORDER — PROMETHAZINE HCL 25 MG/ML IJ SOLN
25.0000 mg | Freq: Once | INTRAMUSCULAR | Status: AC
  Administered 2017-01-20: 25 mg via INTRAVENOUS
  Filled 2017-01-20: qty 1

## 2017-01-20 MED ORDER — FENTANYL CITRATE (PF) 100 MCG/2ML IJ SOLN
50.0000 ug | Freq: Once | INTRAMUSCULAR | Status: AC
  Administered 2017-01-20: 50 ug via INTRAVENOUS
  Filled 2017-01-20: qty 2

## 2017-01-20 MED ORDER — ONDANSETRON 4 MG PO TBDP
4.0000 mg | ORAL_TABLET | Freq: Once | ORAL | Status: AC | PRN
  Administered 2017-01-20: 4 mg via ORAL
  Filled 2017-01-20: qty 1

## 2017-01-20 MED ORDER — SODIUM CHLORIDE 0.9 % IV BOLUS (SEPSIS)
1000.0000 mL | Freq: Once | INTRAVENOUS | Status: AC
  Administered 2017-01-20: 1000 mL via INTRAVENOUS

## 2017-01-20 NOTE — Discharge Instructions (Signed)
Take Zofran as needed for nausea and vomiting. Wait a few minutes before eating or drinking anything after you take this. Drink plenty of fluids and get plenty of rest. Eat a diet of bland foods that will not upset her stomach. Follow-up with a primary care physician for reevaluation of your symptoms. Return to the ED immediately if any concerning signs or symptoms develop such as inability to keep any food or drink down, fevers, blood in your urine or stool, or worsening symptoms.

## 2017-01-20 NOTE — ED Provider Notes (Signed)
[facility]-EMERGENCY DEPT Provider Note   CSN: [id number] Arrival date & time: 01/20/17  1405     History   Chief Complaint Chief Complaint  Patient presents with  . Abdominal Pain  . Emesis  . Diarrhea    HPI Michelle Cordova is a 21 y.o. female with history of bipolar 2 disorder, lupus, and PTSD who presents today with chief complaint acute onset of progressively improving nausea and vomiting. She states that she awoke at around 9 AM today and has been vomiting "every 10 minutes ". Emesis is nonbloody and nonbilious. She endorses associated nausea but no abdominal pain. No aggravating or alleviating factors noted. She denies fever but endorses chills, denies chest pain, shortness of breath, diarrhea, constipation, melena, hematochezia, or urinary symptoms. She thinks her symptoms may be related to a sensitivity to Diflucan, which she took last night for treatment of a yeast infection. States she was also recently treated for BV with Flagyl 1-2 weeks ago. No other recent antibiotic use. She states that her vaginal itching and discharge have resolved and she denies any other vaginal symptoms. She states her nausea has improved after being given Zofran.  The history is provided by the patient.    Past Medical History:  Diagnosis Date  . Bipolar 2 disorder (HCC)   . Lupus   . PTSD (post-traumatic stress disorder)     Patient Active Problem List   Diagnosis Date Noted  . 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 Bipolar 2 disorder (HCC) 12/31/2015    No past surgical history on file.  OB History    No data available       Home Medications    Prior to Admission medications   Medication Sig Start Date End Date Taking? Authorizing Provider  hydrOXYzine (ATARAX/VISTARIL) 25 MG tablet Take 1 tablet (25 mg total) by mouth every 6 (six) hours as needed for anxiety. 01/02/16  Yes Adonis Brook, NP  OLANZapine (ZYPREXA) 5 MG tablet  Take 5 mg by mouth at bedtime.   Yes [provider]  OXcarbazepine (TRILEPTAL) 150 MG tablet Take 1 tablet (150 mg total) by mouth 2 (two) times daily. 01/02/16  Yes Adonis Brook, NP  ondansetron (ZOFRAN ODT) 4 MG disintegrating tablet Take 1 tablet (4 mg total) by mouth every 8 (eight) hours as needed for nausea or vomiting. 01/20/17   Tajah Schreiner A, PA-C  traZODone (DESYREL) 50 MG tablet Take 1 tablet (50 mg total) by mouth at bedtime and may repeat dose one time if needed. Patient not taking: Reported on 01/20/2017 01/02/16   Adonis Brook, NP    Family History No family history on file.  Social History Social History  Substance Use Topics  . Smoking status: Never Smoker  . Smokeless tobacco: Never Used  . Alcohol use Yes     Comment: rarely but socially     Allergies   Fluconazole   Review of Systems Review of Systems  Constitutional: Positive for chills. Negative for fever.  Respiratory: Negative for shortness of breath.   Cardiovascular: Negative for chest pain.  Gastrointestinal: Positive for nausea and vomiting. Negative for abdominal pain, blood in stool, constipation and diarrhea.  Genitourinary: Negative for decreased urine volume, dysuria, flank pain, hematuria, vaginal bleeding, vaginal discharge and vaginal pain.  All other systems reviewed and are negative.    Physical Exam Updated Vital Signs BP 126/88   Pulse 61   Temp (!) 97.1 F (36.2 C) (Oral)   Resp  17   LMP 01/20/2017 (Exact Date)   SpO2 100%   Physical Exam  Constitutional: She appears well-developed and well-nourished. No distress.  Resting comfortably in bed  HENT:  Head: Normocephalic and atraumatic.  Eyes: Conjunctivae are normal. Right eye exhibits no discharge. Left eye exhibits no discharge.  Neck: Normal range of motion. Neck supple. No JVD present. No tracheal deviation present.  Cardiovascular: Normal rate, regular rhythm and normal heart sounds.   Pulmonary/Chest:  Effort normal and breath sounds normal. No respiratory distress. She has no wheezes. She has no rales. She exhibits no tenderness.  Abdominal: Soft. Bowel sounds are normal. She exhibits no distension. There is no tenderness.  Murphy's sign absent, Rovsing sign absent, no tenderness to palpation at McBurney's point, no CVA tenderness  Musculoskeletal: She exhibits no edema.  Neurological: She is alert.  Skin: Skin is warm and dry. No erythema.  Psychiatric: She has a normal mood and affect. Her behavior is normal.  Nursing note and vitals reviewed.    ED Treatments / Results  Labs (all labs ordered are listed, but only abnormal results are displayed) Labs Reviewed  COMPREHENSIVE METABOLIC PANEL - Abnormal; Notable for the following:       Result Value   Chloride 113 (*)    CO2 18 (*)    Glucose, Bld 102 (*)    BUN 21 (*)    Creatinine, Ser 1.30 (*)    Total Protein 8.6 (*)    Albumin 5.2 (*)    AST 68 (*)    ALT 62 (*)    GFR calc non Af Amer 59 (*)    All other components within normal limits  CBC - Abnormal; Notable for the following:    RBC 5.26 (*)    Hemoglobin 15.2 (*)    All other components within normal limits  URINALYSIS, ROUTINE W REFLEX MICROSCOPIC - Abnormal; Notable for the following:    Hgb urine dipstick MODERATE (*)    Squamous Epithelial / LPF 0-5 (*)    All other components within normal limits  LIPASE, BLOOD  PREGNANCY, URINE    EKG  EKG Interpretation None       Radiology No results found.  Procedures Procedures (including critical care time)  Medications Ordered in ED Medications  ondansetron (ZOFRAN-ODT) disintegrating tablet 4 mg (4 mg Oral Given 01/20/17 1601)  fentaNYL (SUBLIMAZE) injection 50 mcg (50 mcg Intravenous Given 01/20/17 1831)  sodium chloride 0.9 % bolus 1,000 mL (0 mLs Intravenous Stopped 01/20/17 1941)  promethazine (PHENERGAN) injection 25 mg (25 mg Intravenous Given 01/20/17 1942)     Initial Impression / Assessment  and Plan / ED Course  I have reviewed the triage vital signs and the nursing notes.  Pertinent labs & imaging results that were available during my care of the patient were reviewed by me and considered in my medical decision making (see chart for details).     Patient with nausea and vomiting since 9 AM this morning after taking Diflucan yesterday. Afebrile, vital signs are stable. She is nontoxic in appearance. Abdominal examination is unremarkable and she has no pain. Labwork is consistent with dehydration and vomiting. UA is not concerning for UTI or nephrolithiasis. She denies any vaginal complaints, and I doubt PID, TOA, ovarian torsion, or ectopic pregnancy. I doubt appendicitis, colitis, cholecystitis, obstruction, or surgical abdominal pathology. Patient given IV fluids, Zofran, and on reevaluation she states she is feeling much better. She is ambulatory, tolerating PO food and fluids, and repeat  examination of the abdomen is unremarkable. Stable for discharge home with Zofran and primary care follow-up outpatient. Discussed indications for return to the ED. Pt verbalized understanding of and agreement with plan and is safe for discharge home at this time.   Final Clinical Impressions(s) / ED Diagnoses   Final diagnoses:  Non-intractable cyclical vomiting with nausea    New Prescriptions New Prescriptions   ONDANSETRON (ZOFRAN ODT) 4 MG DISINTEGRATING TABLET    Take 1 tablet (4 mg total) by mouth every 8 (eight) hours as needed for nausea or vomiting.     Jeanie Sewer, PA-C 01/20/17 2048    Charlynne Pander, MD 01/23/17 [phone]

## 2017-01-20 NOTE — ED Notes (Signed)
Patient is aware of urine specimen but cannot go at this time. Patient has a specimen cup at bedside.

## 2017-01-20 NOTE — ED Triage Notes (Signed)
Pt states that she began having abd pain, NVD that started at 9am today.

## 2017-01-22 ENCOUNTER — Emergency Department (HOSPITAL_COMMUNITY): Payer: Medicaid Other

## 2017-01-22 ENCOUNTER — Emergency Department (HOSPITAL_COMMUNITY)
Admission: EM | Admit: 2017-01-22 | Discharge: 2017-01-22 | Disposition: A | Discharge status: Home / Self Care | Payer: Medicaid Other | Attending: Emergency Medicine | Admitting: Emergency Medicine

## 2017-01-22 DIAGNOSIS — R1084 Generalized abdominal pain: Secondary | ICD-10-CM | POA: Diagnosis not present

## 2017-01-22 DIAGNOSIS — R112 Nausea with vomiting, unspecified: Secondary | ICD-10-CM

## 2017-01-22 DIAGNOSIS — N939 Abnormal uterine and vaginal bleeding, unspecified: Secondary | ICD-10-CM | POA: Diagnosis not present

## 2017-01-22 DIAGNOSIS — R197 Diarrhea, unspecified: Secondary | ICD-10-CM | POA: Diagnosis not present

## 2017-01-22 DIAGNOSIS — Z79899 Other long term (current) drug therapy: Secondary | ICD-10-CM | POA: Diagnosis not present

## 2017-01-22 LAB — COMPREHENSIVE METABOLIC PANEL
ALT: 42 U/L (ref 14–54)
AST: 22 U/L (ref 15–41)
Albumin: 4.3 g/dL (ref 3.5–5.0)
Alkaline Phosphatase: 49 U/L (ref 38–126)
Anion gap: 9 (ref 5–15)
BILIRUBIN TOTAL: 0.9 mg/dL (ref 0.3–1.2)
BUN: 11 mg/dL (ref 6–20)
CALCIUM: 9 mg/dL (ref 8.9–10.3)
CHLORIDE: 112 mmol/L — AB (ref 101–111)
CO2: 19 mmol/L — ABNORMAL LOW (ref 22–32)
CREATININE: 0.94 mg/dL (ref 0.44–1.00)
Glucose, Bld: 77 mg/dL (ref 65–99)
Potassium: 3.3 mmol/L — ABNORMAL LOW (ref 3.5–5.1)
Sodium: 140 mmol/L (ref 135–145)
TOTAL PROTEIN: 7.4 g/dL (ref 6.5–8.1)

## 2017-01-22 LAB — CBC
HEMATOCRIT: 41.9 % (ref 36.0–46.0)
Hemoglobin: 14.7 g/dL (ref 12.0–15.0)
MCH: 29.2 pg (ref 26.0–34.0)
MCHC: 35.1 g/dL (ref 30.0–36.0)
MCV: 83.1 fL (ref 78.0–100.0)
PLATELETS: 301 10*3/uL (ref 150–400)
RBC: 5.04 MIL/uL (ref 3.87–5.11)
RDW: 13.8 % (ref 11.5–15.5)
WBC: 6.5 10*3/uL (ref 4.0–10.5)

## 2017-01-22 LAB — I-STAT BETA HCG BLOOD, ED (MC, WL, AP ONLY)

## 2017-01-22 LAB — URINALYSIS, ROUTINE W REFLEX MICROSCOPIC
Bilirubin Urine: NEGATIVE
GLUCOSE, UA: NEGATIVE mg/dL
KETONES UR: 20 mg/dL — AB
Leukocytes, UA: NEGATIVE
Nitrite: NEGATIVE
PROTEIN: NEGATIVE mg/dL
Specific Gravity, Urine: 1.016 (ref 1.005–1.030)
pH: 5 (ref 5.0–8.0)

## 2017-01-22 LAB — WET PREP, GENITAL
Clue Cells Wet Prep HPF POC: NONE SEEN
Sperm: NONE SEEN
TRICH WET PREP: NONE SEEN
YEAST WET PREP: NONE SEEN

## 2017-01-22 LAB — LIPASE, BLOOD: Lipase: 24 U/L (ref 11–51)

## 2017-01-22 MED ORDER — IOPAMIDOL (ISOVUE-300) INJECTION 61%
INTRAVENOUS | Status: AC
  Filled 2017-01-22: qty 30

## 2017-01-22 MED ORDER — IOPAMIDOL (ISOVUE-300) INJECTION 61%
INTRAVENOUS | Status: AC
  Filled 2017-01-22: qty 100

## 2017-01-22 MED ORDER — SODIUM CHLORIDE 0.9 % IV BOLUS (SEPSIS): 1000.0000 mL | Freq: Once | INTRAVENOUS | Status: DC

## 2017-01-22 MED ORDER — MORPHINE SULFATE (PF) 4 MG/ML IV SOLN
4.0000 mg | Freq: Once | INTRAVENOUS | Status: AC
  Administered 2017-01-22: 4 mg via INTRAVENOUS
  Filled 2017-01-22: qty 1

## 2017-01-22 MED ORDER — MORPHINE SULFATE (PF) 4 MG/ML IV SOLN
4.0000 mg | Freq: Once | INTRAVENOUS | Status: DC
Start: 2017-01-22 — End: 2017-01-22

## 2017-01-22 MED ORDER — IOPAMIDOL (ISOVUE-300) INJECTION 61%: 30.0000 mL | Freq: Once | INTRAVENOUS | Status: DC | PRN

## 2017-01-22 MED ORDER — ONDANSETRON HCL 4 MG/2ML IJ SOLN
4.0000 mg | Freq: Once | INTRAMUSCULAR | Status: AC
  Administered 2017-01-22: 4 mg via INTRAVENOUS
  Filled 2017-01-22: qty 2

## 2017-01-22 MED ORDER — IOPAMIDOL (ISOVUE-300) INJECTION 61%
100.0000 mL | Freq: Once | INTRAVENOUS | Status: AC | PRN
  Administered 2017-01-22: 100 mL via INTRAVENOUS

## 2017-01-22 NOTE — ED Notes (Signed)
Pt family member verbalizing pt "needs a room for IV." This Clinical research associate revitalized pt and reassured her/family member waiting on available room.

## 2017-01-22 NOTE — Discharge Instructions (Signed)
Please read instructions below. Fill your prescription for Zofran to treat your nausea at home. Drink clear fluids as tolerated and slowly advance your diet to bland foods. Schedule and appointment with the women's clinic and primary care to follow up on your symptoms. Return to the ER for new or concerning symptoms.

## 2017-01-22 NOTE — ED Provider Notes (Signed)
[facility]-EMERGENCY DEPT Provider Note   CSN: [id number] Arrival date & time: 01/22/17  1038     History   Chief Complaint Chief Complaint  Patient presents with  . Emesis    HPI Michelle Cordova is a 21 y.o. female w PMHx bipolar 2 disorder, lupus, presenting to ED for subsequent visit for N/V/D that began 01/20/17. Pt was seen in ED that day with normal workup and d/c w symptomatic management. No imaging was done. She states symptoms have been persistent since her ED visit, with nonbloody nonbilious emesis "something is killing me." States she did not fill her rx for Zofran from recent ED visit. Has not taken medications for pain. States she has been unable to keep her Zyprexa down, and this had been causing her to have visual hallucinations of people in front of her. She states these symptoms of psychosis, as well as bipolar d/o, were treated by the Zyprexa. She also reports she has been having intermittent vaginal spotting since LMP in August, which she states she did not mention during her previous ED visit.  She denies history of ovarian cysts or other pelvic problems. She denies vaginal discharge, urinary symptoms, fever, chest pain, shortness of breath.   The history is provided by the patient.    Past Medical History:  Diagnosis Date  . Bipolar 2 disorder (HCC)   . Lupus   . PTSD (post-traumatic stress disorder)     Patient Active Problem List   Diagnosis Date Noted  . 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 Bipolar 2 disorder (HCC) 12/31/2015    No past surgical history on file.  OB History    No data available       Home Medications    Prior to Admission medications   Medication Sig Start Date End Date Taking? Authorizing Provider  hydrOXYzine (ATARAX/VISTARIL) 25 MG tablet Take 1 tablet (25 mg total) by mouth every 6 (six) hours as needed for anxiety. 01/02/16  Yes Adonis Brook, NP  medroxyPROGESTERone  (DEPO-PROVERA) 150 MG/ML injection Inject 150 mg into the muscle every 3 (three) months.   Yes [provider]  OLANZapine (ZYPREXA) 5 MG tablet Take 5 mg by mouth at bedtime.   Yes [provider]  ondansetron (ZOFRAN ODT) 4 MG disintegrating tablet Take 1 tablet (4 mg total) by mouth every 8 (eight) hours as needed for nausea or vomiting. 01/20/17   Fawze, Mina A, PA-C  OXcarbazepine (TRILEPTAL) 150 MG tablet Take 1 tablet (150 mg total) by mouth 2 (two) times daily. 01/02/16   Adonis Brook, NP  traZODone (DESYREL) 50 MG tablet Take 1 tablet (50 mg total) by mouth at bedtime and may repeat dose one time if needed. Patient not taking: Reported on 01/20/2017 01/02/16   Adonis Brook, NP    Family History No family history on file.  Social History Social History  Substance Use Topics  . Smoking status: Never Smoker  . Smokeless tobacco: Never Used  . Alcohol use Yes     Comment: rarely but socially     Allergies   Fluconazole   Review of Systems Review of Systems  Constitutional: Positive for chills. Negative for fever.  Gastrointestinal: Positive for abdominal pain, diarrhea, nausea and vomiting. Negative for blood in stool.  Genitourinary: Positive for vaginal bleeding. Negative for dysuria, frequency, pelvic pain and vaginal discharge.     Physical Exam Updated Vital Signs BP 120/79 (BP Location: Left Arm)   Pulse 60  Temp 98.2 F (36.8 C) (Oral)   Resp 16   LMP 01/20/2017 (Exact Date)   SpO2 100%   Physical Exam  Constitutional: She appears well-developed and well-nourished. No distress.  HENT:  Head: Normocephalic and atraumatic.  Mouth/Throat: Oropharynx is clear and moist.  Eyes: Conjunctivae are normal.  Cardiovascular: Normal rate, regular rhythm, normal heart sounds and intact distal pulses.  Exam reveals no friction rub.   No murmur heard. Pulmonary/Chest: Effort normal and breath sounds normal. No respiratory distress. She has no  wheezes. She has no rales.  Abdominal: Soft. Normal appearance and bowel sounds are normal. There is tenderness in the epigastric area and left upper quadrant. There is no rebound, no guarding and negative Murphy's sign.  Genitourinary: Vagina normal. There is no rash or tenderness on the right labia. There is no rash or tenderness on the left labia. Cervix exhibits discharge (small amount of blood at os). Cervix exhibits no motion tenderness and no friability. Right adnexum displays no mass and no tenderness. Left adnexum displays no mass and no tenderness.  Genitourinary Comments: Exam performed with chaperone present.  Neurological: She is alert.  Skin: Skin is warm.  Psychiatric: She has a normal mood and affect. Her behavior is normal.  Nursing note and vitals reviewed.    ED Treatments / Results  Labs (all labs ordered are listed, but only abnormal results are displayed) Labs Reviewed  WET PREP, GENITAL - Abnormal; Notable for the following:       Result Value   WBC, Wet Prep HPF POC MANY (*)    All other components within normal limits  COMPREHENSIVE METABOLIC PANEL - Abnormal; Notable for the following:    Potassium 3.3 (*)    Chloride 112 (*)    CO2 19 (*)    All other components within normal limits  URINALYSIS, ROUTINE W REFLEX MICROSCOPIC - Abnormal; Notable for the following:    Hgb urine dipstick LARGE (*)    Ketones, ur 20 (*)    Bacteria, UA RARE (*)    Squamous Epithelial / LPF 0-5 (*)    All other components within normal limits  LIPASE, BLOOD  CBC  I-STAT BETA HCG BLOOD, ED ([facility], [facility], [facility] ONLY)  GC/CHLAMYDIA PROBE AMP ([place]) NOT AT [facility]    EKG  EKG Interpretation  Date/Time:  Thursday January 22 2017 15:41:39 EDT Ventricular Rate:  51 PR Interval:    QRS Duration: 88 QT Interval:  447 QTC Calculation: 412 R Axis:   15 Text Interpretation:  Sinus rhythm No old tracing to compare Confirmed by Linwood Dibbles [phone]) on 01/22/2017 3:45:55 PM        Radiology Ct Abdomen Pelvis W Contrast  Result Date: 01/22/2017 CLINICAL DATA:  Nausea, vomiting, diarrhea. EXAM: CT ABDOMEN AND PELVIS WITH CONTRAST TECHNIQUE: Multidetector CT imaging of the abdomen and pelvis was performed using the standard protocol following bolus administration of intravenous contrast. CONTRAST:  ISOVUE-300 IOPAMIDOL (ISOVUE-300) INJECTION 61% COMPARISON:  None. FINDINGS: Lower chest: No acute abnormality. Hepatobiliary: No focal liver abnormality is seen. No gallstones, gallbladder wall thickening, or biliary dilatation. Pancreas: Unremarkable. No pancreatic ductal dilatation or surrounding inflammatory changes. Spleen: Normal in size without focal abnormality. Adrenals/Urinary Tract: Adrenal glands are unremarkable. Kidneys are normal, without renal calculi, focal lesion, or hydronephrosis. Bladder is unremarkable. Stomach/Bowel: Stomach is within normal limits. Appendix appears normal. No evidence of bowel wall thickening, distention, or inflammatory changes. Vascular/Lymphatic: No significant vascular findings are present. No enlarged abdominal or pelvic  lymph nodes. Reproductive: Uterus and left ovary appear normal. 3.4 cm right ovarian cyst is noted. Other: No abdominal wall hernia or abnormality. No abdominopelvic ascites. Musculoskeletal: No acute or significant osseous findings. IMPRESSION: No significant abnormality seen in the abdomen or pelvis. Electronically Signed   By: Lupita Raider, M.D.   On: 01/22/2017 19:13    Procedures Procedures (including critical care time)  Medications Ordered in ED Medications  iopamidol (ISOVUE-300) 61 % injection 30 mL (not administered)  iopamidol (ISOVUE-300) 61 % injection (not administered)  iopamidol (ISOVUE-300) 61 % injection (not administered)  morphine 4 MG/ML injection 4 mg (4 mg Intravenous Not Given 01/22/17 2035)  sodium chloride 0.9 % bolus 1,000 mL (1,000 mLs Intravenous Not Given 01/22/17 2035)   ondansetron (ZOFRAN) injection 4 mg (4 mg Intravenous Given 01/22/17 1523)  morphine 4 MG/ML injection 4 mg (4 mg Intravenous Given 01/22/17 1527)  iopamidol (ISOVUE-300) 61 % injection 100 mL (100 mLs Intravenous Contrast Given 01/22/17 1837)     Initial Impression / Assessment and Plan / ED Course  I have reviewed the triage vital signs and the nursing notes.  Pertinent labs & imaging results that were available during my care of the patient were reviewed by me and considered in my medical decision making (see chart for details).     Pt presenting with subsequent visit for nausea/vomiting/diarrhea. Patient did not fill her prescription for Zofran that she was given at discharge during her last visit. Patient with upper abdl tenderness on exam, no peritoneal signs. CT abdomen negative for acute pathology. Pelvic exam without CMT or adnexal tenderness, no masses. Exam not concerning for pelvic etiology of symptoms. Afebrile, nontoxic, not in distress. Labs unremarkable, hCG negative, U/A without infection. Wet prep without bacteria or yeast.  On re-eval, Pt did not have episode of emesis in ED, tolerating PO, pain improved.  Recommend women's outpt clinic follow up for intermittent vaginal spotting, and PCP follow up if symptoms persist. Return precautions discussed.  Discussed results, findings, treatment and follow up. Patient advised of return precautions. Patient verbalized understanding and agreed with plan.  Final Clinical Impressions(s) / ED Diagnoses   Final diagnoses:  Nausea vomiting and diarrhea  Generalized abdominal pain    New Prescriptions Discharge Medication List as of 01/22/2017  7:55 PM       Russo, [place] N, PA-C 01/22/17 2214    Linwood Dibbles, MD 01/23/17 [phone]

## 2017-01-22 NOTE — ED Notes (Signed)
Lab called to inform that patients sodium and potassium are critical but the specimen is highly hemolyzed. Informed lab staff would do a recollect for a light green. Informed [place], PA of the delay of results.

## 2017-01-22 NOTE — ED Notes (Signed)
Patient is aware of pelvic exam and aware of urine specimen.

## 2017-01-22 NOTE — ED Notes (Signed)
Pt visitor called 911 stating that there was a seizure in the parking lot. Pt not seizing and is in the lobby. [facility] came and was looking for a seizure in the parking lot, when staff and resources were used, we discovered that it was this patient's visitor. [employer] advising patient at this time.

## 2017-01-22 NOTE — ED Triage Notes (Signed)
Pt presents with N/V/D x 3 days. Was previously seen for same complaint and has not had any relief. Denies fever.

## 2017-01-23 LAB — GC/CHLAMYDIA PROBE AMP (~~LOC~~) NOT AT ARMC
Chlamydia: NEGATIVE
Neisseria Gonorrhea: NEGATIVE

## 2018-01-25 ENCOUNTER — Encounter (HOSPITAL_COMMUNITY): Payer: Self-pay | Admitting: Emergency Medicine

## 2018-01-25 ENCOUNTER — Emergency Department (HOSPITAL_COMMUNITY)
Admission: EM | Admit: 2018-01-25 | Discharge: 2018-01-25 | Discharge status: Absent without leave | Payer: Medicaid Other

## 2018-01-25 NOTE — ED Triage Notes (Signed)
Pt reports that she had issues with her menstrual cycles since she started having them years ago.  Pt states that she will just follow up with a gynecologist and left

## 2018-04-29 ENCOUNTER — Encounter: Payer: Self-pay | Admitting: Obstetrics and Gynecology

## 2018-07-07 ENCOUNTER — Encounter (HOSPITAL_COMMUNITY): Payer: Self-pay | Admitting: *Deleted

## 2018-07-07 ENCOUNTER — Emergency Department (HOSPITAL_COMMUNITY)
Admission: EM | Admit: 2018-07-07 | Discharge: 2018-07-07 | Disposition: A | Discharge status: Home / Self Care | Payer: BLUE CROSS/BLUE SHIELD | Attending: Emergency Medicine | Admitting: Emergency Medicine

## 2018-07-07 ENCOUNTER — Other Ambulatory Visit: Payer: Self-pay

## 2018-07-07 ENCOUNTER — Ambulatory Visit (INDEPENDENT_AMBULATORY_CARE_PROVIDER_SITE_OTHER): Payer: BLUE CROSS/BLUE SHIELD | Admitting: Certified Nurse Midwife

## 2018-07-07 ENCOUNTER — Encounter: Payer: Self-pay | Admitting: Certified Nurse Midwife

## 2018-07-07 VITALS — BP 137/83 | HR 96 | Ht 62.0 in | Wt 106.8 lb

## 2018-07-07 DIAGNOSIS — B373 Candidiasis of vulva and vagina: Secondary | ICD-10-CM

## 2018-07-07 DIAGNOSIS — Z79899 Other long term (current) drug therapy: Secondary | ICD-10-CM | POA: Diagnosis not present

## 2018-07-07 DIAGNOSIS — Z113 Encounter for screening for infections with a predominantly sexual mode of transmission: Secondary | ICD-10-CM

## 2018-07-07 DIAGNOSIS — M3210 Systemic lupus erythematosus, organ or system involvement unspecified: Secondary | ICD-10-CM | POA: Insufficient documentation

## 2018-07-07 DIAGNOSIS — N898 Other specified noninflammatory disorders of vagina: Secondary | ICD-10-CM

## 2018-07-07 DIAGNOSIS — F17210 Nicotine dependence, cigarettes, uncomplicated: Secondary | ICD-10-CM | POA: Diagnosis not present

## 2018-07-07 DIAGNOSIS — F319 Bipolar disorder, unspecified: Secondary | ICD-10-CM | POA: Diagnosis not present

## 2018-07-07 NOTE — ED Provider Notes (Signed)
[facility] EMERGENCY DEPARTMENT Provider Note   CSN: [id number] Arrival date & time: 07/07/18  1200    History   Chief Complaint Chief Complaint  Patient presents with  . Vaginal Discharge    HPI Michelle Cordova is a 23 y.o. female.     23 year old female presents with vaginal discharge, suspect bacterial vaginosis.  Patient denies fevers, lesions, pelvic pain or any other complaints or concerns.     Past Medical History:  Diagnosis Date  . Bipolar 2 disorder (HCC)   . Lupus (HCC)   . PTSD (post-traumatic stress disorder)     Patient Active Problem List   Diagnosis Date Noted  . 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 Bipolar 2 disorder (HCC) 12/31/2015    History reviewed. No pertinent surgical history.   OB History   No obstetric history on file.      Home Medications    Prior to Admission medications   Medication Sig Start Date End Date Taking? Authorizing Provider  hydrOXYzine (ATARAX/VISTARIL) 25 MG tablet Take 1 tablet (25 mg total) by mouth every 6 (six) hours as needed for anxiety. 01/02/16   Adonis Brook, NP  medroxyPROGESTERone (DEPO-PROVERA) 150 MG/ML injection Inject 150 mg into the muscle every 3 (three) months.    [provider]  OLANZapine (ZYPREXA) 5 MG tablet Take 5 mg by mouth at bedtime.    [provider]  ondansetron (ZOFRAN ODT) 4 MG disintegrating tablet Take 1 tablet (4 mg total) by mouth every 8 (eight) hours as needed for nausea or vomiting. 01/20/17   Fawze, Mina A, PA-C  OXcarbazepine (TRILEPTAL) 150 MG tablet Take 1 tablet (150 mg total) by mouth 2 (two) times daily. 01/02/16   Adonis Brook, NP  traZODone (DESYREL) 50 MG tablet Take 1 tablet (50 mg total) by mouth at bedtime and may repeat dose one time if needed. Patient not taking: Reported on 01/20/2017 01/02/16   Adonis Brook, NP    Family History History reviewed. No pertinent  family history.  Social History Social History   Tobacco Use  . Smoking status: Never Smoker  . Smokeless tobacco: Never Used  Substance Use Topics  . Alcohol use: Yes    Comment: rarely but socially  . Drug use: Yes    Types: Marijuana    Comment: occasionally     Allergies   Fluconazole   Review of Systems Review of Systems  Constitutional: Negative for fever.  Gastrointestinal: Negative for abdominal pain, constipation, diarrhea, nausea and vomiting.  Genitourinary: Positive for vaginal discharge. Negative for pelvic pain.  Skin: Negative for rash.  Psychiatric/Behavioral: Negative for confusion.     Physical Exam Updated Vital Signs BP 121/80 (BP Location: Right Arm)   Pulse 83   Temp 99.6 F (37.6 C) (Oral)   Resp 18   Ht 5\' 2"  (1.575 m)   Wt 47.6 kg   LMP 06/08/2018   SpO2 100%   BMI 19.20 kg/m   Physical Exam Vitals signs and nursing note reviewed.  Constitutional:      General: She is not in acute distress.    Appearance: She is well-developed. She is not diaphoretic.  HENT:     Head: Normocephalic and atraumatic.  Cardiovascular:     Rate and Rhythm: Normal rate and regular rhythm.     Pulses: Normal pulses.     Heart sounds: Normal heart sounds.  Pulmonary:     Effort: Pulmonary effort is normal.  Breath sounds: Normal breath sounds.  Abdominal:     General: Abdomen is flat.     Palpations: Abdomen is soft.     Tenderness: There is no abdominal tenderness.  Skin:    General: Skin is warm and dry.  Neurological:     Mental Status: She is alert and oriented to person, place, and time.  Psychiatric:        Behavior: Behavior normal.      ED Treatments / Results  Labs (all labs ordered are listed, but only abnormal results are displayed) Labs Reviewed - No data to display  EKG None  Radiology No results found.  Procedures Procedures (including critical care time)  Medications Ordered in ED Medications - No data to  display   Initial Impression / Assessment and Plan / ED Course  I have reviewed the triage vital signs and the nursing notes.  Pertinent labs & imaging results that were available during my care of the patient were reviewed by me and considered in my medical decision making (see chart for details).  Clinical Course as of Jul 07 1214  Wed Jul 07, 2018  [id number] 23 year old female presents with complaint of vaginal discharge suspects bacterial vaginosis.  Patient does not have any pelvic pain, abdomen soft nontender, lung sounds are clear.  [facility] today.   [LM]    Clinical Course User Index [LM] Jeannie Fend, PA-C   Final Clinical Impressions(s) / ED Diagnoses   Final diagnoses:  Vaginal discharge    ED Discharge Orders    None       Alden Hipp 07/07/18 1216    Arby Barrette, MD 07/18/18 1257

## 2018-07-07 NOTE — ED Triage Notes (Signed)
PT reported she tested positive for BV and is here for RX's

## 2018-07-07 NOTE — Patient Instructions (Signed)
Vaginitis    Vaginitis is irritation and swelling (inflammation) of the vagina. It happens when normal bacteria and yeast in the vagina grow too much. There are many types of this condition. Treatment will depend on the type you have.  Follow these instructions at home:  Lifestyle  · Keep your vagina area clean and dry.  ? Avoid using soap.  ? Rinse the area with water.  · Do not do the following until your doctor says it is okay:  ? Wash and clean out the vagina (douche).  ? Use tampons.  ? Have sex.  · Wipe from front to back after going to the bathroom.  · Let air reach your vagina.  ? Wear cotton underwear.  ? Do not wear:  ? Underwear while you sleep.  ? Tight pants.  ? Thong underwear.  ? Underwear or nylons without a cotton panel.  ? Take off any wet clothing, such as bathing suits, as soon as possible.  · Use gentle, non-scented products. Do not use things that can irritate the vagina, such as fabric softeners. Avoid the following products if they are scented:  ? Feminine sprays.  ? Detergents.  ? Tampons.  ? Feminine hygiene products.  ? Soaps or bubble baths.  · Practice safe sex and use condoms.  General instructions  · Take over-the-counter and prescription medicines only as told by your doctor.  · If you were prescribed an antibiotic medicine, take or use it as told by your doctor. Do not stop taking or using the antibiotic even if you start to feel better.  · Keep all follow-up visits as told by your doctor. This is important.  Contact a doctor if:  · You have pain in your belly.  · You have a fever.  · Your symptoms last for more than 2-3 days.  Get help right away if:  · You have a fever and your symptoms get worse all of a sudden.  Summary  · Vaginitis is irritation and swelling of the vagina. It can happen when the normal bacteria and yeast in the vagina grow too much. There are many types.  · Treatment will depend on the type you have.  · Do not douche, use tampons , or have sex until your health  care provider approves. When you can return to sex, practice safe sex and use condoms.  This information is not intended to replace advice given to you by your health care provider. Make sure you discuss any questions you have with your health care provider.  Document Released: 07/18/2008 Document Revised: 05/13/2016 Document Reviewed: 05/13/2016  Elsevier Interactive Patient Education © 2019 [employer]vier Inc[employer]

## 2018-07-07 NOTE — ED Notes (Signed)
Declined W/C at D/C and was escorted to lobby by RN. 

## 2018-07-07 NOTE — Progress Notes (Signed)
   GYNECOLOGY OFFICE VISIT NOTE  History:  23 y.o. here today for vaginal itching and tightness. Sx started 2 days ago. She denies any abnormal vaginal discharge, malodor, bleeding, pelvic pain or other concerns. No new partner for last 2 years. Thinks she may have BV, reports getting it about twice a year.  Past Medical History:  Diagnosis Date  . Bipolar 2 disorder (HCC)   . Lupus (HCC)   . PTSD (post-traumatic stress disorder)     History reviewed. No pertinent surgical history.  The following portions of the patient's history were reviewed and updated as appropriate: allergies, current medications, past family history, past medical history, past social history, past surgical history and problem list.    Review of Systems:  Genito-Urinary ROS: positive for - vulvar/vaginal symptoms negative for - dyspareunia, genital discharge or pelvic pain   Objective:  Physical Exam BP 137/83   Pulse 96   Ht 5\' 2"  (1.575 m)   Wt 48.4 kg   LMP 06/08/2018 (Exact Date)   BMI 19.53 kg/m  CONSTITUTIONAL: Well-developed, well-nourished female in no acute distress.  HENT:  Normocephalic, atraumatic NECK: Normal range of motion SKIN: Skin is warm and dry NEUROLOGIC: Alert and oriented to person, place, and time PSYCHIATRIC: Normal mood and affect CARDIOVASCULAR: Normal heart rate noted RESPIRATORY: Effort and rate normal PELVIC: mild erythema of vulva, thick white discharge at introitus MUSCULOSKELETAL: Normal range of motion  Labs and Imaging No results found.  Assessment & Plan:   1. Vaginal itching   - Aptima swab - may use Lotrimin cream externally for suspected yeast - tub soaks with baking soda for comfort - pelvic rest  Follow up prn   Total face-to-face time with patient: 10 minutes   Donette Larry, [place] 07/07/2018 6:05 PM

## 2018-07-09 LAB — CERVICOVAGINAL ANCILLARY ONLY
BACTERIAL VAGINITIS: NEGATIVE
Candida vaginitis: POSITIVE — AB
Chlamydia: NEGATIVE
Neisseria Gonorrhea: NEGATIVE
Trichomonas: NEGATIVE

## 2018-07-12 ENCOUNTER — Telehealth: Payer: Self-pay | Admitting: *Deleted

## 2018-07-12 DIAGNOSIS — N898 Other specified noninflammatory disorders of vagina: Secondary | ICD-10-CM

## 2018-07-12 MED ORDER — TERCONAZOLE 0.4 % VA CREA: 1.0000 | TOPICAL_CREAM | Freq: Every day | VAGINAL | 0 refills | Status: DC

## 2018-07-12 NOTE — Telephone Encounter (Signed)
-----   Message from Donette Larry, [place] sent at 07/09/2018  4:40 PM EST ----- +yeast, needs Diflucan 150 mg po x1

## 2018-07-12 NOTE — Telephone Encounter (Signed)
Prescription for Terazol sent to the [facility] on [address]

## 2018-07-12 NOTE — Telephone Encounter (Signed)
Pt came by the office to discuss her results.  Pt informed of yeast infection and that terconazole cream was sent to the [facility] on [address].  Pt also advised that if she did not want to use the terconazole or if it was too expensive, then she could try OTC Monistat.  Pt verbalized understanding.

## 2018-07-12 NOTE — Telephone Encounter (Signed)
Called to inform pt that she has a yeast infection Pt did not pick up and her voicemail was not set up.

## 2018-07-14 ENCOUNTER — Encounter: Payer: Self-pay | Admitting: Obstetrics and Gynecology

## 2018-07-14 ENCOUNTER — Telehealth: Payer: Self-pay | Admitting: Obstetrics and Gynecology

## 2018-07-14 NOTE — Telephone Encounter (Signed)
The patient visited our office today for a doctor's note. She stated she forgot to ask the nurse she spoke with on 07/12/2018. She requested to have a doctors note for school for two days as she could not attend with the issue she was experiencing. I talked with the nurse she talked to on 07/12/2018 being that the patient did not have an appointment on that particular visit. The nurse stated she agreed to the two days for the patient to be excused from school.

## 2018-09-10 IMAGING — CT CT ABD-PELV W/ CM
2 of 4 series · 17 of 46 positions shown, 19 images · IV contrast (ISOVUE)
Comparison: None.

CLINICAL DATA: Nausea, vomiting, diarrhea.

EXAM:
CT ABDOMEN AND PELVIS WITH CONTRAST
TECHNIQUE: Multidetector CT imaging of the abdomen and pelvis was performed
using the standard protocol following bolus administration of
intravenous contrast.
CONTRAST:  100mL [id number] IOPAMIDOL (SDI94E-J88) INJECTION 61%

[Series 2: abd/pel with · axial · 0.58mm/px · z∈[+1240,+1584]mm · 14 of 77 slices shown, 16 images]
[im 4/77  soft-tissue]
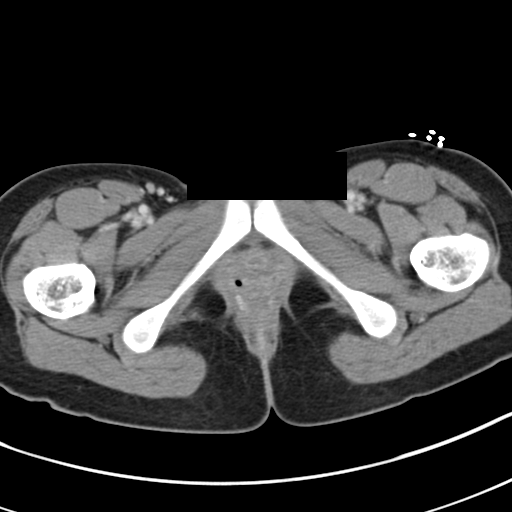
[im 4/77  bone]
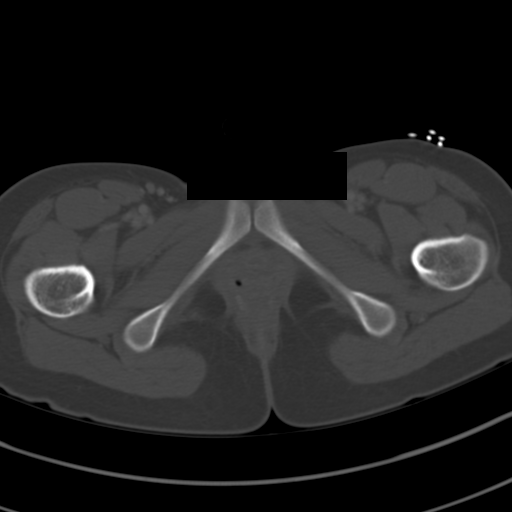
[im 12/77  soft-tissue]
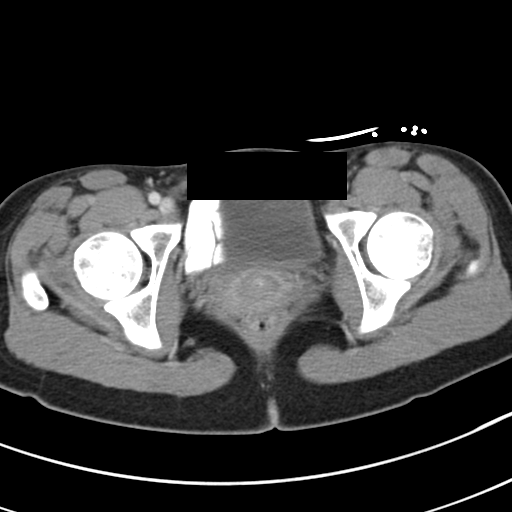
[im 16/77  soft-tissue]
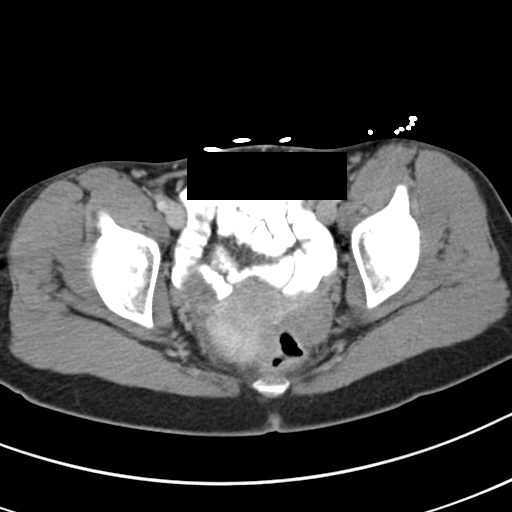
[im 20/77  soft-tissue]
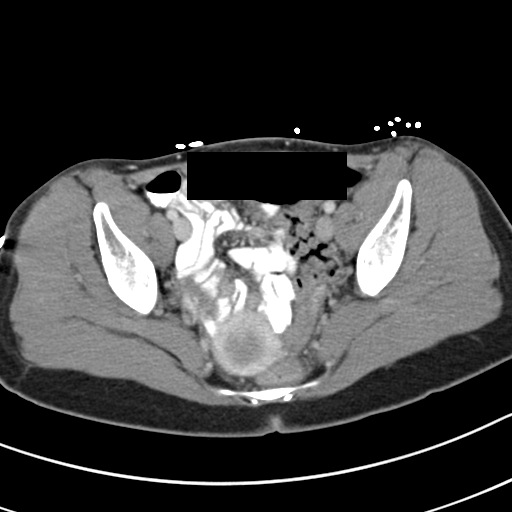
[im 27/77  soft-tissue]
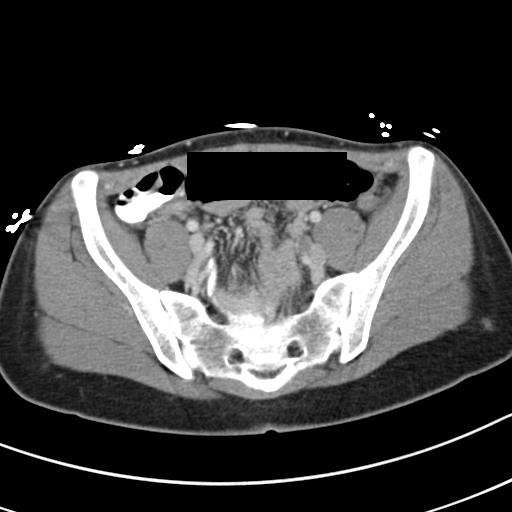
[im 31/77  soft-tissue]
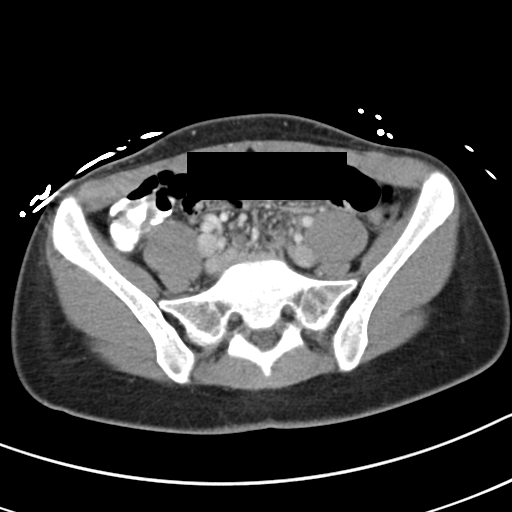
[im 35/77  soft-tissue]
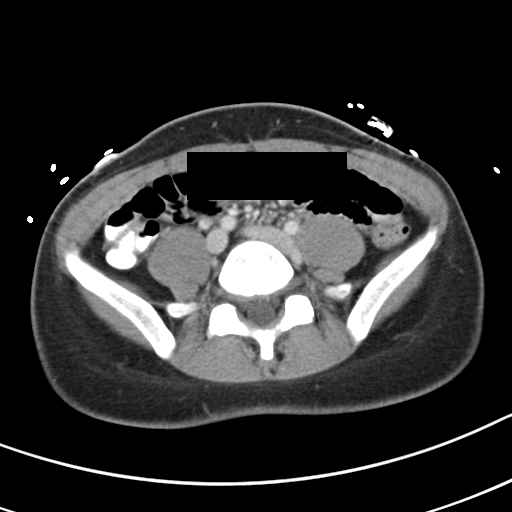
[im 42/77  soft-tissue]
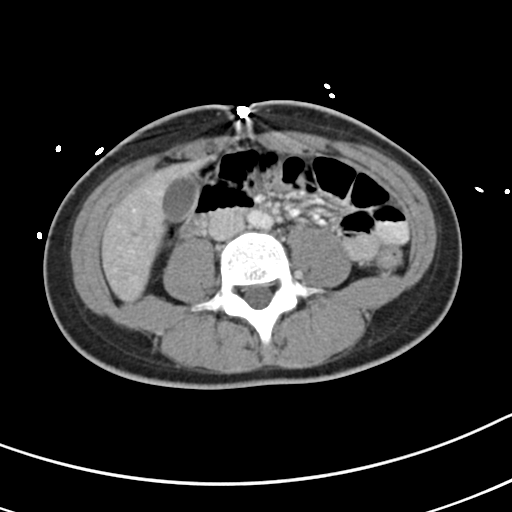
[im 46/77  soft-tissue]
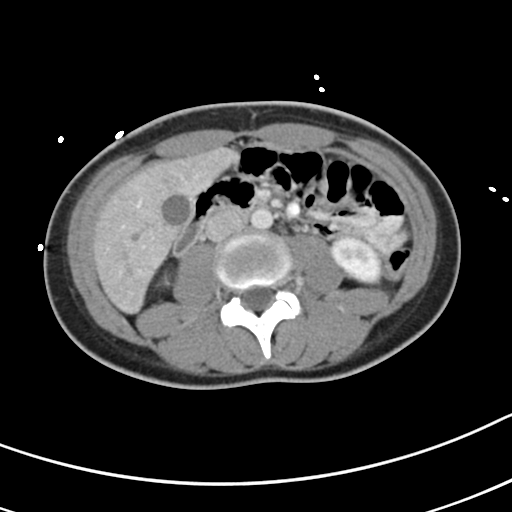
[im 46/77  bone]
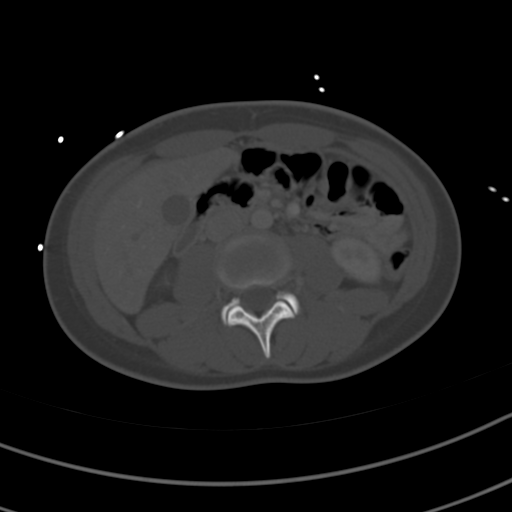
[im 50/77  soft-tissue]
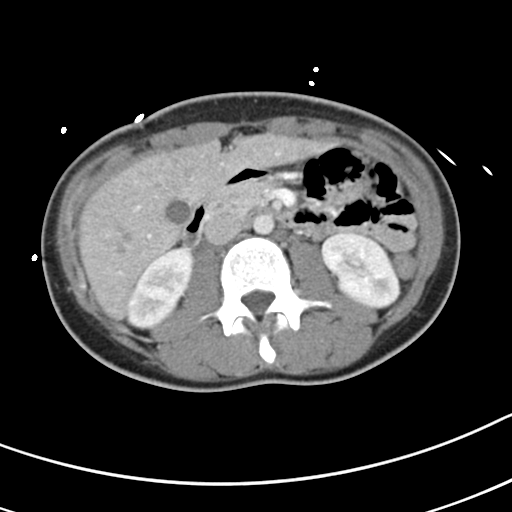
[im 58/77  soft-tissue]
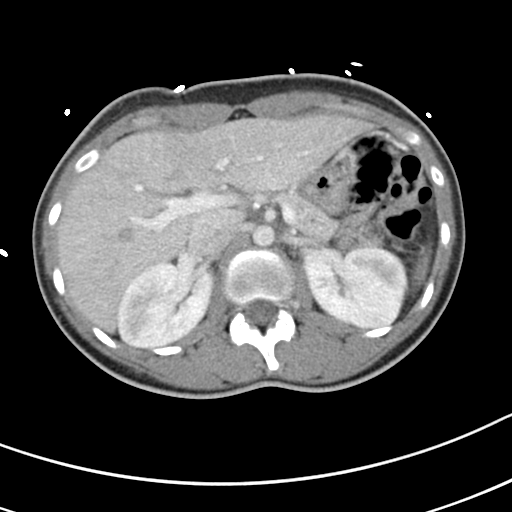
[im 61/77  soft-tissue]
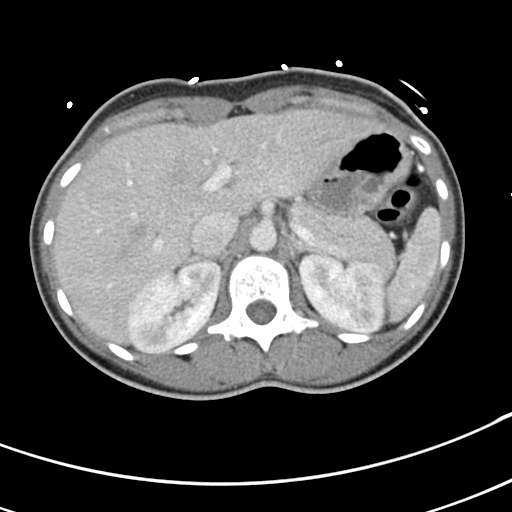
[im 65/77  soft-tissue]
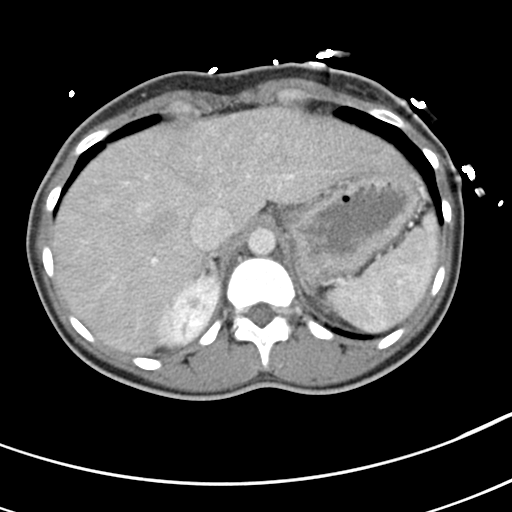
[im 73/77  soft-tissue]
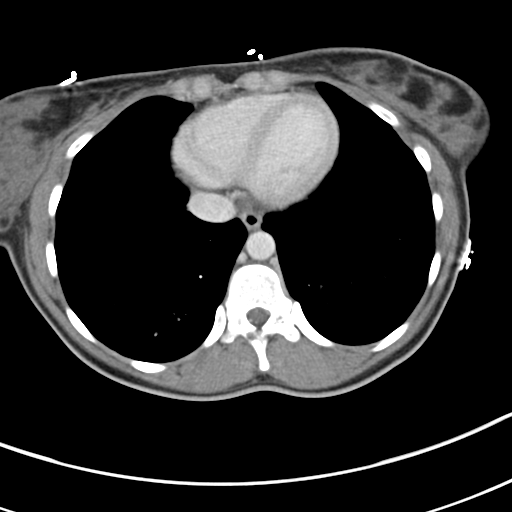

[Series 5: coronal a/|p · coronal · 0.74mm/px · 3 of 114 slices shown]
[im 38/114  soft-tissue]
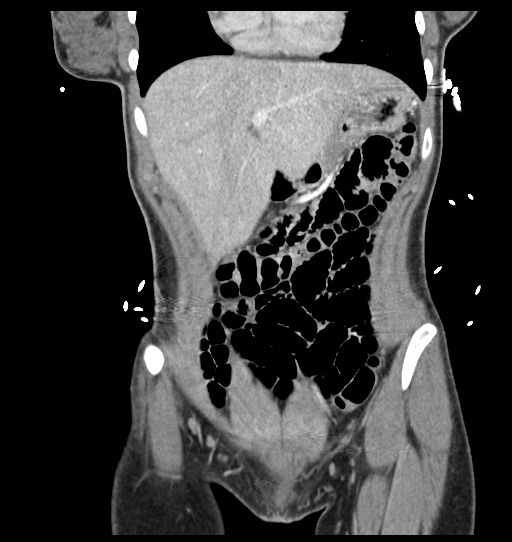
[im 51/114  soft-tissue]
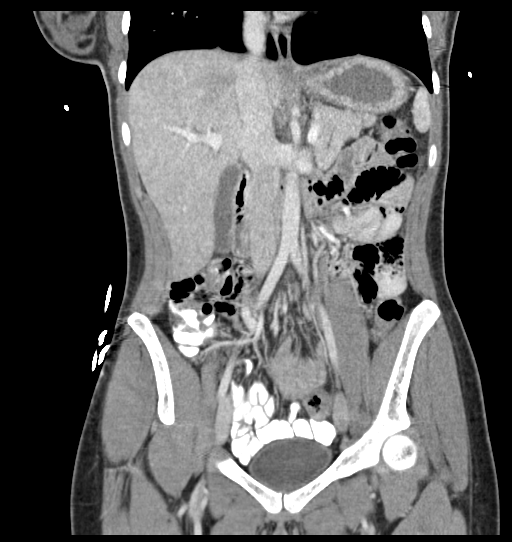
[im 63/114  soft-tissue]
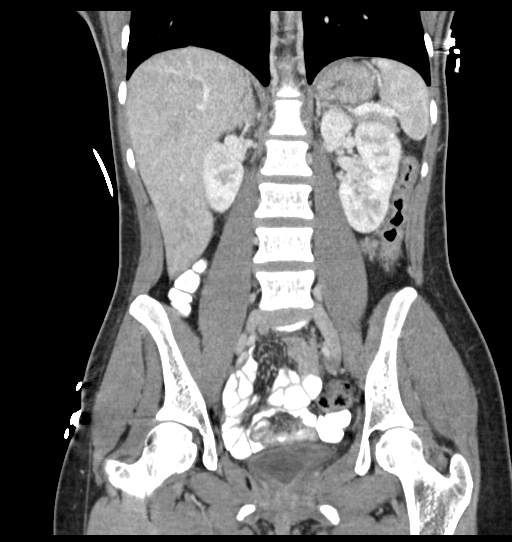

[17 of 46 positions shown; findings below may reference images not displayed]

FINDINGS: Lower chest: No acute abnormality.

Hepatobiliary: No focal liver abnormality is seen. No gallstones,
gallbladder wall thickening, or biliary dilatation.

Pancreas: Unremarkable. No pancreatic ductal dilatation or
surrounding inflammatory changes.

Spleen: Normal in size without focal abnormality.

Adrenals/Urinary Tract: Adrenal glands are unremarkable. Kidneys are
normal, without renal calculi, focal lesion, or hydronephrosis.
Bladder is unremarkable.

Stomach/Bowel: Stomach is within normal limits. Appendix appears
normal. No evidence of bowel wall thickening, distention, or
inflammatory changes.

Vascular/Lymphatic: No significant vascular findings are present. No
enlarged abdominal or pelvic lymph nodes.

Reproductive: Uterus and left ovary appear normal. 3.4 cm right
ovarian cyst is noted.

Other: No abdominal wall hernia or abnormality. No abdominopelvic
ascites.

Musculoskeletal: No acute or significant osseous findings.
IMPRESSION: No significant abnormality seen in the abdomen or pelvis.

## 2018-10-08 ENCOUNTER — Encounter: Payer: Self-pay | Admitting: Family Medicine

## 2018-10-08 ENCOUNTER — Ambulatory Visit (INDEPENDENT_AMBULATORY_CARE_PROVIDER_SITE_OTHER): Payer: BC Managed Care – PPO | Admitting: General Practice

## 2018-10-08 ENCOUNTER — Other Ambulatory Visit: Payer: Self-pay

## 2018-10-08 DIAGNOSIS — Z3201 Encounter for pregnancy test, result positive: Secondary | ICD-10-CM

## 2018-10-08 DIAGNOSIS — O219 Vomiting of pregnancy, unspecified: Secondary | ICD-10-CM

## 2018-10-08 LAB — POCT PREGNANCY, URINE: Preg Test, Ur: POSITIVE — AB

## 2018-10-08 MED ORDER — PROMETHAZINE HCL 25 MG PO TABS: 25.0000 mg | ORAL_TABLET | Freq: Four times a day (QID) | ORAL | 0 refills | Status: DC | PRN

## 2018-10-08 MED ORDER — PRENATAL PLUS 27-1 MG PO TABS: 1.0000 | ORAL_TABLET | Freq: Every day | ORAL | 11 refills | Status: DC

## 2018-10-08 NOTE — Progress Notes (Signed)
Patient presents to office today for UPT. UPT +. Patient reports first positive home test Monday. LMP 08/13/18 EDD 05/20/19 8w today. Patient denies taking any meds/vitamins- reports nausea/queasiness. Rx sent in for phenergan & PNV per protocol and patient informed. Patient is unsure about termination versus keeping pregnancy but is leaning towards keeping the pregnancy. She desires to go ahead & set up OB appts. Patient had no questions.  Chase Caller RN BSN 10/08/18

## 2018-10-11 NOTE — Progress Notes (Signed)
I have reviewed this chart and agree with the RN/CMA assessment and management.    K. Meryl Davis, M.D. Attending [facility] (Faculty Practice)   

## 2018-10-13 ENCOUNTER — Encounter: Payer: Self-pay | Admitting: *Deleted

## 2018-10-13 ENCOUNTER — Telehealth: Payer: Self-pay | Admitting: Obstetrics & Gynecology

## 2018-10-13 ENCOUNTER — Other Ambulatory Visit: Payer: Self-pay

## 2018-10-13 ENCOUNTER — Telehealth (INDEPENDENT_AMBULATORY_CARE_PROVIDER_SITE_OTHER): Payer: BC Managed Care – PPO | Admitting: *Deleted

## 2018-10-13 DIAGNOSIS — O0990 Supervision of high risk pregnancy, unspecified, unspecified trimester: Secondary | ICD-10-CM

## 2018-10-13 NOTE — Telephone Encounter (Signed)
Attempted to call patient about her appointment on 6/10 @ 1:15 to verify that she had the app downloaded and informed about it being a virtual visit. No answer, voicemail was left to ensure that she had the app downloaded on the device she was going to use for the appointment and that she had access to it. Patient was not screened for covid symptoms

## 2018-10-13 NOTE — Progress Notes (Signed)
I connected with  Michelle Cordova on 10/13/18 at  1:15 PM EDT by telephone and verified that I am speaking with the correct person using two identifiers.   I discussed the limitations, risks, security and privacy concerns of performing an evaluation and management service by telephone and the availability of in person appointments. I also discussed with the patient that there may be a patient responsible charge related to this service. The patient expressed understanding and agreed to proceed.  New Ob intake completed via phone. Pt was unable to access the MyChart App at the time of the visit due to limited data availability on her phone. Pt was advised that her prenatal care will consist of a combination of virtual visits as well as face to face visits. [id number] visitation policy explained. Pt states she has previously been told she has Lupus however it was unclear if full work-up was done. She is not currently under supervision for this medical condition. Pt agrees to check BP once weekly and enter results to [facility] app. She does not have insurance at this time but has access to BP cuff from a family member. Pt voiced understanding of all information and instructions given.   Day, Ronnell Freshwater, RN 10/13/2018  5:14 PM

## 2018-10-14 NOTE — Progress Notes (Signed)
I have reviewed the chart and agree with nursing staff's documentation of this patient's encounter.  Verita Schneiders, MD 10/14/2018 9:45 AM

## 2018-10-19 ENCOUNTER — Other Ambulatory Visit: Payer: Self-pay | Admitting: *Deleted

## 2018-10-19 DIAGNOSIS — O0990 Supervision of high risk pregnancy, unspecified, unspecified trimester: Secondary | ICD-10-CM

## 2018-10-19 MED ORDER — AMBULATORY NON FORMULARY MEDICATION: 1.0000 | 0 refills | Status: DC

## 2018-10-26 ENCOUNTER — Telehealth: Payer: Self-pay | Admitting: *Deleted

## 2018-10-26 NOTE — Telephone Encounter (Signed)
Called pt regarding babyscripts.  Per review, pt has never logged a blood pressure.  Per chart review, BP cuff ordered on 6/16.  Pt did not pick up.  Left message advising pt that she was being contacted regarding babyscripts and requesting she contact the clinic.  Mychart message sent.

## 2018-11-08 ENCOUNTER — Telehealth: Payer: Self-pay | Admitting: Family Medicine

## 2018-11-08 NOTE — Telephone Encounter (Signed)
Attempted to call patient about schedule change due to changes made in the office. I was not able to get her on the phone, or leave a message.

## 2018-11-09 ENCOUNTER — Telehealth: Payer: Self-pay | Admitting: Nurse Practitioner

## 2018-11-09 NOTE — Telephone Encounter (Signed)
Attempted to call patient about her appointment on 7/8 @ 2:55. No answer, first number stated that the call could not be completed at this time and the second number rang busy after ringing for several minutes.

## 2018-11-10 ENCOUNTER — Ambulatory Visit (INDEPENDENT_AMBULATORY_CARE_PROVIDER_SITE_OTHER): Payer: BC Managed Care – PPO | Admitting: Nurse Practitioner

## 2018-11-10 ENCOUNTER — Other Ambulatory Visit (HOSPITAL_COMMUNITY)
Admission: RE | Admit: 2018-11-10 | Discharge: 2018-11-10 | Disposition: A | Discharge status: Home / Self Care | Payer: BC Managed Care – PPO | Source: Ambulatory Visit | Attending: Obstetrics & Gynecology | Admitting: Obstetrics & Gynecology

## 2018-11-10 ENCOUNTER — Encounter: Payer: BC Managed Care – PPO | Admitting: Obstetrics & Gynecology

## 2018-11-10 ENCOUNTER — Encounter: Payer: Self-pay | Admitting: Nurse Practitioner

## 2018-11-10 ENCOUNTER — Other Ambulatory Visit: Payer: Self-pay

## 2018-11-10 VITALS — BP 102/64 | HR 88 | Wt 113.0 lb

## 2018-11-10 DIAGNOSIS — O0991 Supervision of high risk pregnancy, unspecified, first trimester: Secondary | ICD-10-CM | POA: Diagnosis not present

## 2018-11-10 DIAGNOSIS — O99343 Other mental disorders complicating pregnancy, third trimester: Secondary | ICD-10-CM | POA: Diagnosis not present

## 2018-11-10 DIAGNOSIS — O0990 Supervision of high risk pregnancy, unspecified, unspecified trimester: Secondary | ICD-10-CM | POA: Diagnosis not present

## 2018-11-10 DIAGNOSIS — O0993 Supervision of high risk pregnancy, unspecified, third trimester: Secondary | ICD-10-CM

## 2018-11-10 DIAGNOSIS — Z3A12 12 weeks gestation of pregnancy: Secondary | ICD-10-CM | POA: Diagnosis not present

## 2018-11-10 DIAGNOSIS — F332 Major depressive disorder, recurrent severe without psychotic features: Secondary | ICD-10-CM

## 2018-11-10 DIAGNOSIS — Z87891 Personal history of nicotine dependence: Secondary | ICD-10-CM | POA: Insufficient documentation

## 2018-11-10 DIAGNOSIS — O219 Vomiting of pregnancy, unspecified: Secondary | ICD-10-CM

## 2018-11-10 DIAGNOSIS — F314 Bipolar disorder, current episode depressed, severe, without psychotic features: Secondary | ICD-10-CM | POA: Diagnosis not present

## 2018-11-10 DIAGNOSIS — F3181 Bipolar II disorder: Secondary | ICD-10-CM

## 2018-11-10 MED ORDER — DOXYLAMINE-PYRIDOXINE 10-10 MG PO TBEC: DELAYED_RELEASE_TABLET | ORAL | 2 refills | Status: DC

## 2018-11-10 MED ORDER — VITAFOL GUMMIES 3.33-0.333-34.8 MG PO CHEW: 3.0000 | CHEWABLE_TABLET | Freq: Every day | ORAL | 11 refills | Status: DC

## 2018-11-10 NOTE — Patient Instructions (Signed)
First Trimester of Pregnancy  The first trimester of pregnancy is from week 1 until the end of week 13 (months 1 through 3). During this time, your baby will begin to develop inside you. At 6-8 weeks, the eyes and face are formed, and the heartbeat can be seen on ultrasound. At the end of 12 weeks, all the baby's organs are formed. Prenatal care is all the medical care you receive before the birth of your baby. Make sure you get good prenatal care and follow all of your doctor's instructions. Follow these instructions at home: Medicines  Take over-the-counter and prescription medicines only as told by your doctor. Some medicines are safe and some medicines are not safe during pregnancy.  Take a prenatal vitamin that contains at least 600 micrograms (mcg) of folic acid.  If you have trouble pooping (constipation), take medicine that will make your stool soft (stool softener) if your doctor approves. Eating and drinking   Eat regular, healthy meals.  Your doctor will tell you the amount of weight gain that is right for you.  Avoid raw meat and uncooked cheese.  If you feel sick to your stomach (nauseous) or throw up (vomit): ? Eat 4 or 5 small meals a day instead of 3 large meals. ? Try eating a few soda crackers. ? Drink liquids between meals instead of during meals.  To prevent constipation: ? Eat foods that are high in fiber, like fresh fruits and vegetables, whole grains, and beans. ? Drink enough fluids to keep your pee (urine) clear or pale yellow. Activity  Exercise only as told by your doctor. Stop exercising if you have cramps or pain in your lower belly (abdomen) or low back.  Do not exercise if it is too hot, too humid, or if you are in a place of great height (high altitude).  Try to avoid standing for long periods of time. Move your legs often if you must stand in one place for a long time.  Avoid heavy lifting.  Wear low-heeled shoes. Sit and stand up straight.   You can have sex unless your doctor tells you not to. Relieving pain and discomfort  Wear a good support bra if your breasts are sore.  Take warm water baths (sitz baths) to soothe pain or discomfort caused by hemorrhoids. Use hemorrhoid cream if your doctor says it is okay.  Rest with your legs raised if you have leg cramps or low back pain.  If you have puffy, bulging veins (varicose veins) in your legs: ? Wear support hose or compression stockings as told by your doctor. ? Raise (elevate) your feet for 15 minutes, 3-4 times a day. ? Limit salt in your food. Prenatal care  Schedule your prenatal visits by the twelfth week of pregnancy.  Write down your questions. Take them to your prenatal visits.  Keep all your prenatal visits as told by your doctor. This is important. Safety  Wear your seat belt at all times when driving.  Make a list of emergency phone numbers. The list should include numbers for family, friends, the hospital, and police and fire departments. General instructions  Ask your doctor for a referral to a local prenatal class. Begin classes no later than at the start of month 6 of your pregnancy.  Ask for help if you need counseling or if you need help with nutrition. Your doctor can give you advice or tell you where to go for help.  Do not use hot tubs, steam   rooms, or saunas.  Do not douche or use tampons or scented sanitary pads.  Do not cross your legs for long periods of time.  Avoid all herbs and alcohol. Avoid drugs that are not approved by your doctor.  Do not use any tobacco products, including cigarettes, chewing tobacco, and electronic cigarettes. If you need help quitting, ask your doctor. You may get counseling or other support to help you quit.  Avoid cat litter boxes and soil used by cats. These carry germs that can cause birth defects in the baby and can cause a loss of your baby (miscarriage) or stillbirth.  Visit your dentist. At home,  brush your teeth with a soft toothbrush. Be gentle when you floss. Contact a doctor if:  You are dizzy.  You have mild cramps or pressure in your lower belly.  You have a nagging pain in your belly area.  You continue to feel sick to your stomach, you throw up, or you have watery poop (diarrhea).  You have a bad smelling fluid coming from your vagina.  You have pain when you pee (urinate).  You have increased puffiness (swelling) in your face, hands, legs, or ankles. Get help right away if:  You have a fever.  You are leaking fluid from your vagina.  You have spotting or bleeding from your vagina.  You have very bad belly cramping or pain.  You gain or lose weight rapidly.  You throw up blood. It may look like coffee grounds.  You are around people who have [place] measles, fifth disease, or chickenpox.  You have a very bad headache.  You have shortness of breath.  You have any kind of trauma, such as from a fall or a car accident. Summary  The first trimester of pregnancy is from week 1 until the end of week 13 (months 1 through 3).  To take care of yourself and your unborn baby, you will need to eat healthy meals, take medicines only if your doctor tells you to do so, and do activities that are safe for you and your baby.  Keep all follow-up visits as told by your doctor. This is important as your doctor will have to ensure that your baby is healthy and growing well. This information is not intended to replace advice given to you by your health care provider. Make sure you discuss any questions you have with your health care provider. Document Released: 10/08/2007 Document Revised: 08/12/2018 Document Reviewed: 04/29/2016 Elsevier Patient Education  [place].  Morning Sickness  Morning sickness is when you feel sick to your stomach (nauseous) during pregnancy. You may feel sick to your stomach and throw up (vomit). You may feel sick in the morning, but you  can feel this way at any time of day. Some women feel very sick to their stomach and cannot stop throwing up (hyperemesis gravidarum). Follow these instructions at home: Medicines  Take over-the-counter and prescription medicines only as told by your doctor. Do not take any medicines until you talk with your doctor about them first.  Taking multivitamins before getting pregnant can stop or lessen the harshness of morning sickness. Eating and drinking  Eat dry toast or crackers before getting out of bed.  Eat 5 or 6 small meals a day.  Eat dry and bland foods like rice and baked potatoes.  Do not eat greasy, fatty, or spicy foods.  Have someone cook for you if the smell of food causes you to feel sick  or throw up.  If you feel sick to your stomach after taking prenatal vitamins, take them at night or with a snack.  Eat protein when you need a snack. Nuts, yogurt, and cheese are good choices.  Drink fluids throughout the day.  Try ginger ale made with real ginger, ginger tea made from fresh grated ginger, or ginger candies. General instructions  Do not use any products that have nicotine or tobacco in them, such as cigarettes and e-cigarettes. If you need help quitting, ask your doctor.  Use an air purifier to keep the air in your house free of smells.  Get lots of fresh air.  Try to avoid smells that make you feel sick.  Try: ? Wearing a bracelet that is used for seasickness (acupressure wristband). ? Going to a doctor who puts thin needles into certain body points (acupuncture) to improve how you feel. Contact a doctor if:  You need medicine to feel better.  You feel dizzy or light-headed.  You are losing weight. Get help right away if:  You feel very sick to your stomach and cannot stop throwing up.  You pass out (faint).  You have very bad pain in your belly. Summary  Morning sickness is when you feel sick to your stomach (nauseous) during pregnancy.  You  may feel sick in the morning, but you can feel this way at any time of day.  Making some changes to what you eat may help your symptoms go away. This information is not intended to replace advice given to you by your health care provider. Make sure you discuss any questions you have with your health care provider. Document Released: 05/29/2004 Document Revised: 04/03/2017 Document Reviewed: 05/22/2016 Elsevier Patient Education  2020 [employer].
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
## 2018-11-11 ENCOUNTER — Encounter: Payer: Self-pay | Admitting: Nurse Practitioner

## 2018-11-11 DIAGNOSIS — O99011 Anemia complicating pregnancy, first trimester: Secondary | ICD-10-CM | POA: Insufficient documentation

## 2018-11-11 LAB — OBSTETRIC PANEL, INCLUDING HIV
Antibody Screen: NEGATIVE
Basophils Absolute: 0 10*3/uL (ref 0.0–0.2)
Basos: 1 %
EOS (ABSOLUTE): 0.1 10*3/uL (ref 0.0–0.4)
Eos: 1 %
HIV Screen 4th Generation wRfx: NONREACTIVE
Hematocrit: 31.6 % — ABNORMAL LOW (ref 34.0–46.6)
Hemoglobin: 9.5 g/dL — ABNORMAL LOW (ref 11.1–15.9)
Hepatitis B Surface Ag: NEGATIVE
Immature Grans (Abs): 0 10*3/uL (ref 0.0–0.1)
Immature Granulocytes: 1 %
Lymphocytes Absolute: 1.6 10*3/uL (ref 0.7–3.1)
Lymphs: 25 %
MCH: 19.9 pg — ABNORMAL LOW (ref 26.6–33.0)
MCHC: 30.1 g/dL — ABNORMAL LOW (ref 31.5–35.7)
MCV: 66 fL — ABNORMAL LOW (ref 79–97)
Monocytes Absolute: 0.6 10*3/uL (ref 0.1–0.9)
Monocytes: 9 %
Neutrophils Absolute: 4.1 10*3/uL (ref 1.4–7.0)
Neutrophils: 63 %
Platelets: 395 10*3/uL (ref 150–450)
RBC: 4.78 x10E6/uL (ref 3.77–5.28)
RDW: 19.5 % — ABNORMAL HIGH (ref 11.7–15.4)
RPR Ser Ql: NONREACTIVE
Rh Factor: NEGATIVE
Rubella Antibodies, IGG: 5.44 index (ref >0.99)
WBC: 6.4 10*3/uL (ref 3.4–10.8)

## 2018-11-12 ENCOUNTER — Encounter: Payer: Self-pay | Admitting: *Deleted

## 2018-11-12 LAB — CYTOLOGY - PAP
Chlamydia: NEGATIVE
Diagnosis: NEGATIVE
Neisseria Gonorrhea: NEGATIVE

## 2018-11-12 LAB — CULTURE, OB URINE

## 2018-11-12 LAB — URINE CULTURE, OB REFLEX

## 2018-11-16 ENCOUNTER — Other Ambulatory Visit: Payer: Self-pay | Admitting: Nurse Practitioner

## 2018-11-16 DIAGNOSIS — O0990 Supervision of high risk pregnancy, unspecified, unspecified trimester: Secondary | ICD-10-CM

## 2018-11-16 NOTE — Addendum Note (Signed)
Addended by: Langston Reusing on: 11/16/2018 01:11 PM   Modules accepted: Orders

## 2018-11-17 ENCOUNTER — Telehealth: Payer: Self-pay | Admitting: Obstetrics & Gynecology

## 2018-11-17 NOTE — Telephone Encounter (Signed)
Called the patient to inform of upcoming bh visit. Received a message the caller you are trying to reach has a voicemail box that is not setup yet, Good-bye.

## 2018-11-18 ENCOUNTER — Encounter: Payer: Self-pay | Admitting: *Deleted

## 2018-11-22 NOTE — BH Specialist Note (Signed)
Integrated Behavioral Health via Telemedicine Video Visit  11/22/2018 Michelle Furiscilla Lamora [id number]  Number of Integrated Behavioral Health visits: 1 Session Start time: 8:23  Session End time: 8:53 Total time: 30 minutes  Referring Provider: Nolene Bernheimerri Burleson, NP Type of Visit: Video [facility]Family [facility] Provider location: [facility] All persons participating in visit: Patient Michelle Cordova and [facility] Jamie McMannes  Confirmed patient's address: Yes  Confirmed patient's phone number: Yes  Any changes to demographics: No   Confirmed patient's insurance: Yes  Any changes to patient's insurance: No   Discussed confidentiality: Yes   I connected with Michelle FuPriscilla Bottoms  by a video enabled telemedicine application and verified that I am speaking with the correct person using two identifiers.     I discussed the limitations of evaluation and management by telemedicine and the availability of in person appointments.  I discussed that the purpose of this visit is to provide behavioral health care while limiting exposure to the novel coronavirus.   Discussed there is a possibility of technology failure and discussed alternative modes of communication if that failure occurs.  I discussed that engaging in this video visit, they consent to the provision of behavioral healthcare and the services will be billed under their insurance.  Patient and/or legal guardian expressed understanding and consented to video visit: Yes   PRESENTING CONCERNS: Patient and/or family reports the following symptoms/concerns: Pt states her primary concern today is nausea; pt reports a history of bipolar disorder, with no recent episode, and no symptoms during current pregnancy.  Duration of problem: Ongoing; Severity of problem: mild  STRENGTHS (Protective Factors/Coping Skills): Self-aware  GOALS ADDRESSED: Patient will: 1.  Maintain reducation of symptoms of: mood instability  2.  Increase  knowledge and/or ability of: healthy habits  3.  Demonstrate ability to: Increase healthy adjustment to current life circumstances and Increase motivation to adhere to plan of care  INTERVENTIONS: Interventions utilized:  Functional Assessment of ADLs and Psychoeducation and/or Health Education Standardized Assessments completed: GAD-7 and PHQ 9  ASSESSMENT: Patient currently experiencing Bipolar affective disorder, in remission  Patient may benefit from psychoeducation and brief therapeutic interventions regarding maintaining reduction of symptoms of bipolar affective disorder .  PLAN: 1. Follow up with behavioral health clinician on : As needed, if symptoms return 2. Behavioral recommendations:  -Begin taking prenatal vitamin daily, as recommended by medical providers -Prioritize healthy sleep nightly (consider sleep as medicine for help to prevent mood instability) 3. Referral(s): [facility]  I discussed the assessment and treatment plan with the patient and/or parent/guardian. They were provided an opportunity to ask questions and all were answered. They agreed with the plan and demonstrated an understanding of the instructions.   They were advised to call back or seek an in-person evaluation if the symptoms worsen or if the condition fails to improve as anticipated.  Valetta CloseJamie C McMannes  Depression screen [facility] 11/23/2018 07/07/2018  Decreased Interest 3 0  Down, Depressed, Hopeless 0 0  PHQ - 2 Score 3 0  Altered sleeping 1 2  Tired, decreased energy 2 0  Change in appetite 2 1  Feeling bad or failure about yourself  0 0  Trouble concentrating 0 0  Moving slowly or fidgety/restless 0 0  Suicidal thoughts 0 0  PHQ-9 Score 8 3   GAD 7 : Generalized Anxiety Score 07/07/2018  Nervous, Anxious, on Edge 0  Control/stop worrying 0  Worry too much - different things 0  Trouble relaxing 0  Restless 0  Easily annoyed or irritable 0  Afraid -  awful might happen 0  Total GAD 7 Score 0

## 2018-11-23 ENCOUNTER — Other Ambulatory Visit: Payer: Self-pay

## 2018-11-23 ENCOUNTER — Telehealth (INDEPENDENT_AMBULATORY_CARE_PROVIDER_SITE_OTHER): Payer: BC Managed Care – PPO | Admitting: Clinical

## 2018-11-23 ENCOUNTER — Telehealth: Payer: Self-pay | Admitting: Family Medicine

## 2018-11-23 DIAGNOSIS — F3170 Bipolar disorder, currently in remission, most recent episode unspecified: Secondary | ICD-10-CM | POA: Diagnosis not present

## 2018-11-23 NOTE — Telephone Encounter (Signed)
Spoke to patient about her appointment on 7/22 @ 2:30. Patient instructed to wear a face mask for the entire appointment and no visitors are allowed with her during the visit. Patient screened for covid symptoms and denied having any

## 2018-11-24 ENCOUNTER — Ambulatory Visit: Payer: BC Managed Care – PPO | Admitting: General Practice

## 2018-11-24 ENCOUNTER — Other Ambulatory Visit: Payer: Self-pay

## 2018-11-24 ENCOUNTER — Ambulatory Visit: Payer: BC Managed Care – PPO

## 2018-11-24 DIAGNOSIS — O0990 Supervision of high risk pregnancy, unspecified, unspecified trimester: Secondary | ICD-10-CM

## 2018-11-24 NOTE — Progress Notes (Signed)
Patient presents to office today for lab work & needs to sign a ROI for dermatology biopsy results per Earlie Server. Spoke with patient and she states she doesn't remember the name of the place she went to so she will just bring a copy in for [place] next time. Asked patient if she had received the BP cuff yet and she states no. [place] who states patient has not met her deductible with the insurance company so she would have to pay for it $29.99. Informed patient and she states she will just pick one up herself at [place]. Patient had no questions.  Koren Bound RN BSN 11/24/18

## 2018-12-03 LAB — LUPUS ANTICOAGULANT PANEL
APTT: 29.4 s
Anticardiolipin Ab, IgG: <10 [GPL'U]
Anticardiolipin Ab, IgM: <10 [MPL'U]
Beta-2 Glycoprotein I, IgA: <10 SAU
Beta-2 Glycoprotein I, IgG: <10 SGU
Beta-2 Glycoprotein I, IgM: <10 SMU
DRVVT Screen Seconds: 32.1 s
Hexagonal Phospholipid Neutral: 0 s
INR: 1 ratio
Platelet Neutralization: 0.5 s
Prothrombin Time: 11 s
Thrombin Time: 16.3 s

## 2018-12-03 LAB — ANA: Anti Nuclear Antibody (ANA): NEGATIVE

## 2018-12-08 ENCOUNTER — Telehealth: Payer: BC Managed Care – PPO | Admitting: Obstetrics & Gynecology

## 2018-12-09 ENCOUNTER — Other Ambulatory Visit: Payer: Self-pay

## 2018-12-09 ENCOUNTER — Telehealth (INDEPENDENT_AMBULATORY_CARE_PROVIDER_SITE_OTHER): Payer: Medicaid Other | Admitting: Obstetrics and Gynecology

## 2018-12-09 ENCOUNTER — Encounter: Payer: Self-pay | Admitting: Obstetrics and Gynecology

## 2018-12-09 ENCOUNTER — Telehealth: Payer: Self-pay | Admitting: Family Medicine

## 2018-12-09 ENCOUNTER — Telehealth: Payer: Self-pay

## 2018-12-09 DIAGNOSIS — O0992 Supervision of high risk pregnancy, unspecified, second trimester: Secondary | ICD-10-CM | POA: Diagnosis not present

## 2018-12-09 DIAGNOSIS — Z6791 Unspecified blood type, Rh negative: Secondary | ICD-10-CM | POA: Diagnosis not present

## 2018-12-09 DIAGNOSIS — O26892 Other specified pregnancy related conditions, second trimester: Secondary | ICD-10-CM | POA: Diagnosis not present

## 2018-12-09 DIAGNOSIS — F3181 Bipolar II disorder: Secondary | ICD-10-CM

## 2018-12-09 DIAGNOSIS — Z3A16 16 weeks gestation of pregnancy: Secondary | ICD-10-CM | POA: Diagnosis not present

## 2018-12-09 DIAGNOSIS — O0990 Supervision of high risk pregnancy, unspecified, unspecified trimester: Secondary | ICD-10-CM

## 2018-12-09 NOTE — Telephone Encounter (Addendum)
-----   Message from Gavin Pound, [place] sent at 12/07/2018 10:19 PM EDT ----- Please call patient and inform of normal Lupus Panel.  However, because her biopsy results showed lupus repeat testing may be necessary at a later time if suspicion remains high or other symptoms manifest. Please encourage patient to ask any related or further questions at her appt with Dr. Rosana Hoes on Aug 6.  Thank You, JE  Called pt and unable to LM on VM on all three listed numbers.  Per chart review, pt had an appt on 12/09/18 with Dr. Rosana Hoes and noted was provider went over pt's results.

## 2018-12-09 NOTE — Progress Notes (Signed)
   TELEHEALTH OBSTETRICS PRENATAL VIRTUAL VIDEO VISIT ENCOUNTER NOTE  Provider location: Center for [employer] at [facility]   I connected with Michelle Cordova on 12/09/18 at  9:35 AM EDT by MyChart Video Encounter at home and verified that I am speaking with the correct person using two identifiers.   I discussed the limitations, risks, security and privacy concerns of performing an evaluation and management service virtually and the availability of in person appointments. I also discussed with the patient that there may be a patient responsible charge related to this service. The patient expressed understanding and agreed to proceed. Subjective:  Michelle Cordova is a 23 y.o. G1P0000 at [redacted]w[redacted]d being seen today for ongoing prenatal care.  She is currently monitored for the following issues for this high-risk pregnancy and has Major depressive disorder, recurrent severe without psychotic features ([place]); Bipolar 2 disorder ([place]); Supervision of high risk pregnancy, antepartum; Former cigarette smoker; Anemia in pregnancy, first trimester; and Rh negative state in antepartum period on their problem list.  Patient reports no complaints.  Contractions: Not present. Vag. Bleeding: None.  Movement: Absent. Denies any leaking of fluid.   The following portions of the patient's history were reviewed and updated as appropriate: allergies, current medications, past family history, past medical history, past social history, past surgical history and problem list.   Objective:  There were no vitals filed for this visit.  Fetal Status:     Movement: Absent     General:  Alert, oriented and cooperative. Patient is in no acute distress.  Respiratory: Normal respiratory effort, no problems with respiration noted  Mental Status: Normal mood and affect. Normal behavior. Normal judgment and thought content.  Rest of physical exam deferred due to type of encounter  Imaging: No results found.   Assessment and Plan:  Pregnancy: G1P0000 at [redacted]w[redacted]d   1. Supervision of high risk pregnancy, antepartum Reviewed labs, low risk panorama  2. Bipolar 2 disorder ([place])  3. Rh negative state in antepartum period Reviewed labs, had extensive conversation about Rh negative and need for Rho gam, verbalized importance of getting care and need for pregnancy care to prevent potential hemolytic disease of newborn in future pregnancies, she verbalizes understanding.  Patient plans on transferring care to practice [facility], does not want to set up further appointments with this office.   Preterm labor symptoms and general obstetric precautions including but not limited to vaginal bleeding, contractions, leaking of fluid and fetal movement were reviewed in detail with the patient. I discussed the assessment and treatment plan with the patient. The patient was provided an opportunity to ask questions and all were answered. The patient agreed with the plan and demonstrated an understanding of the instructions. The patient was advised to call back or seek an in-person office evaluation/go to [facility]U at [facility] for any urgent or concerning symptoms. Please refer to After Visit Summary for other counseling recommendations.   I provided 25 minutes of face-to-face time during this encounter.  Return in about 4 weeks (around 01/06/2019) for OB visit, virtual.  Future Appointments  Date Time Provider [place]  12/30/2018  2:00 PM [place] [facility]  12/30/2018  2:00 PM [facility] [place] 3 [facility]-M[facility] M[facility]-US    Sequoya Hogsett M Trei Schoch, [place] for [place], [place]

## 2018-12-09 NOTE — Telephone Encounter (Signed)
No further visits, patient transferring care to practice in [place]

## 2018-12-22 ENCOUNTER — Emergency Department (HOSPITAL_COMMUNITY)
Admission: EM | Admit: 2018-12-22 | Discharge: 2018-12-22 | Disposition: A | Discharge status: Home / Self Care | Payer: Medicaid Other | Attending: Emergency Medicine | Admitting: Emergency Medicine

## 2018-12-22 ENCOUNTER — Encounter (HOSPITAL_COMMUNITY): Payer: Self-pay

## 2018-12-22 ENCOUNTER — Other Ambulatory Visit: Payer: Self-pay

## 2018-12-22 DIAGNOSIS — K226 Gastro-esophageal laceration-hemorrhage syndrome: Secondary | ICD-10-CM

## 2018-12-22 DIAGNOSIS — O210 Mild hyperemesis gravidarum: Secondary | ICD-10-CM | POA: Diagnosis not present

## 2018-12-22 DIAGNOSIS — O219 Vomiting of pregnancy, unspecified: Secondary | ICD-10-CM | POA: Diagnosis present

## 2018-12-22 DIAGNOSIS — Z87891 Personal history of nicotine dependence: Secondary | ICD-10-CM | POA: Diagnosis not present

## 2018-12-22 DIAGNOSIS — Z3A18 18 weeks gestation of pregnancy: Secondary | ICD-10-CM | POA: Diagnosis not present

## 2018-12-22 LAB — COMPREHENSIVE METABOLIC PANEL
ALT: 19 U/L (ref 0–44)
AST: 20 U/L (ref 15–41)
Albumin: 3.4 g/dL — ABNORMAL LOW (ref 3.5–5.0)
Alkaline Phosphatase: 38 U/L (ref 38–126)
Anion gap: 9 (ref 5–15)
BUN: 6 mg/dL (ref 6–20)
CO2: 21 mmol/L — ABNORMAL LOW (ref 22–32)
Calcium: 8.8 mg/dL — ABNORMAL LOW (ref 8.9–10.3)
Chloride: 105 mmol/L (ref 98–111)
Creatinine, Ser: 0.56 mg/dL (ref 0.44–1.00)
GFR calc Af Amer: >60 mL/min (ref >60)
GFR calc non Af Amer: >60 mL/min (ref >60)
Glucose, Bld: 77 mg/dL (ref 70–99)
Potassium: 3.6 mmol/L (ref 3.5–5.1)
Sodium: 135 mmol/L (ref 135–145)
Total Bilirubin: <0.1 mg/dL — ABNORMAL LOW (ref 0.3–1.2)
Total Protein: 7 g/dL (ref 6.5–8.1)

## 2018-12-22 LAB — CBC WITH DIFFERENTIAL/PLATELET
Abs Immature Granulocytes: 0.03 10*3/uL (ref 0.00–0.07)
Abs Immature Granulocytes: 0.06 10*3/uL (ref 0.00–0.07)
Basophils Absolute: 0 10*3/uL (ref 0.0–0.1)
Basophils Absolute: 0 10*3/uL (ref 0.0–0.1)
Basophils Relative: 0 %
Basophils Relative: 0 %
Eosinophils Absolute: 0.1 10*3/uL (ref 0.0–0.5)
Eosinophils Absolute: 0.1 10*3/uL (ref 0.0–0.5)
Eosinophils Relative: 1 %
Eosinophils Relative: 1 %
HCT: 30 % — ABNORMAL LOW (ref 36.0–46.0)
HCT: 26.8 % — ABNORMAL LOW (ref 36.0–46.0)
Hemoglobin: 8.5 g/dL — ABNORMAL LOW (ref 12.0–15.0)
Hemoglobin: 7.8 g/dL — ABNORMAL LOW (ref 12.0–15.0)
Immature Granulocytes: 0 %
Immature Granulocytes: 1 %
Lymphocytes Relative: 16 %
Lymphocytes Relative: 15 %
Lymphs Abs: 1.1 10*3/uL (ref 0.7–4.0)
Lymphs Abs: 1.2 10*3/uL (ref 0.7–4.0)
MCH: 20.5 pg — ABNORMAL LOW (ref 26.0–34.0)
MCH: 20.7 pg — ABNORMAL LOW (ref 26.0–34.0)
MCHC: 28.3 g/dL — ABNORMAL LOW (ref 30.0–36.0)
MCHC: 29.1 g/dL — ABNORMAL LOW (ref 30.0–36.0)
MCV: 72.3 fL — ABNORMAL LOW (ref 80.0–100.0)
MCV: 71.3 fL — ABNORMAL LOW (ref 80.0–100.0)
Monocytes Absolute: 0.5 10*3/uL (ref 0.1–1.0)
Monocytes Absolute: 0.5 10*3/uL (ref 0.1–1.0)
Monocytes Relative: 8 %
Monocytes Relative: 7 %
Neutro Abs: 5.1 10*3/uL (ref 1.7–7.7)
Neutro Abs: 5.9 10*3/uL (ref 1.7–7.7)
Neutrophils Relative %: 75 %
Neutrophils Relative %: 76 %
Platelets: 343 10*3/uL (ref 150–400)
Platelets: 321 10*3/uL (ref 150–400)
RBC: 4.15 MIL/uL (ref 3.87–5.11)
RBC: 3.76 MIL/uL — ABNORMAL LOW (ref 3.87–5.11)
RDW: 19.9 % — ABNORMAL HIGH (ref 11.5–15.5)
RDW: 19.6 % — ABNORMAL HIGH (ref 11.5–15.5)
WBC: 6.8 10*3/uL (ref 4.0–10.5)
WBC: 7.7 10*3/uL (ref 4.0–10.5)
nRBC: 0 % (ref 0.0–0.2)
nRBC: 0 % (ref 0.0–0.2)

## 2018-12-22 LAB — CBC
HCT: 29.8 % — ABNORMAL LOW (ref 36.0–46.0)
Hemoglobin: 8.5 g/dL — ABNORMAL LOW (ref 12.0–15.0)
MCH: 20.4 pg — ABNORMAL LOW (ref 26.0–34.0)
MCHC: 28.5 g/dL — ABNORMAL LOW (ref 30.0–36.0)
MCV: 71.6 fL — ABNORMAL LOW (ref 80.0–100.0)
Platelets: 333 10*3/uL (ref 150–400)
RBC: 4.16 MIL/uL (ref 3.87–5.11)
RDW: 19.6 % — ABNORMAL HIGH (ref 11.5–15.5)
WBC: 6.8 10*3/uL (ref 4.0–10.5)
nRBC: 0 % (ref 0.0–0.2)

## 2018-12-22 LAB — URINALYSIS, ROUTINE W REFLEX MICROSCOPIC
Bilirubin Urine: NEGATIVE
Glucose, UA: NEGATIVE mg/dL
Hgb urine dipstick: NEGATIVE
Ketones, ur: NEGATIVE mg/dL
Leukocytes,Ua: NEGATIVE
Nitrite: NEGATIVE
Protein, ur: NEGATIVE mg/dL
Specific Gravity, Urine: 1.018 (ref 1.005–1.030)
pH: 7 (ref 5.0–8.0)

## 2018-12-22 LAB — LIPASE, BLOOD: Lipase: 28 U/L (ref 11–51)

## 2018-12-22 LAB — HCG, QUANTITATIVE, PREGNANCY: hCG, Beta Chain, Quant, S: 33992 m[IU]/mL — ABNORMAL HIGH (ref <5)

## 2018-12-22 MED ORDER — SODIUM CHLORIDE 0.9% FLUSH
3.0000 mL | Freq: Once | INTRAVENOUS | Status: AC
  Administered 2018-12-22: 10:00:00 3 mL via INTRAVENOUS

## 2018-12-22 MED ORDER — FAMOTIDINE 20 MG PO TABS: 20.0000 mg | ORAL_TABLET | Freq: Two times a day (BID) | ORAL | 0 refills | Status: DC

## 2018-12-22 MED ORDER — ONDANSETRON HCL 4 MG PO TABS: 4.0000 mg | ORAL_TABLET | Freq: Four times a day (QID) | ORAL | 0 refills | Status: DC

## 2018-12-22 MED ORDER — METOCLOPRAMIDE HCL 10 MG PO TABS: 10.0000 mg | ORAL_TABLET | Freq: Four times a day (QID) | ORAL | 0 refills | Status: DC

## 2018-12-22 MED ORDER — PANTOPRAZOLE SODIUM 40 MG IV SOLR: 40.0000 mg | Freq: Once | INTRAVENOUS | Status: DC

## 2018-12-22 MED ORDER — SODIUM CHLORIDE 0.9 % IV BOLUS
1000.0000 mL | Freq: Once | INTRAVENOUS | Status: AC
  Administered 2018-12-22: 10:00:00 1000 mL via INTRAVENOUS

## 2018-12-22 MED ORDER — VITAMIN B-6 25 MG PO TABS: 25.0000 mg | ORAL_TABLET | Freq: Three times a day (TID) | ORAL | 0 refills | Status: DC

## 2018-12-22 MED ORDER — DOXYLAMINE SUCCINATE (SLEEP) 25 MG PO TABS: 12.5000 mg | ORAL_TABLET | Freq: Every evening | ORAL | 0 refills | Status: DC | PRN
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
## 2018-12-22 NOTE — ED Notes (Addendum)
Called lab to check on CBC repeat, stated they would run lab

## 2018-12-22 NOTE — ED Triage Notes (Signed)
Patient reports that she vomits every morning x 16 weeks. Patient states she is 5 months pregnant.

## 2018-12-22 NOTE — Discharge Instructions (Addendum)
Begin taking Pepcid twice daily as prescribed.  You can take the separated version of diplegia's by taking doxylamine 25 mg at bedtime and pyridoxine 3 times daily.  If this is not working, you can take Zofran or Reglan every 6 hours as needed for nausea or vomiting.  Please follow-up closely with your OB/GYN to make them aware of your visit today.  Please return to the emergency department immediately if you develop any new or worsening symptoms including worsening bloody vomiting.

## 2018-12-29 ENCOUNTER — Encounter: Payer: Self-pay | Admitting: *Deleted

## 2018-12-30 ENCOUNTER — Encounter (HOSPITAL_COMMUNITY): Payer: Self-pay

## 2018-12-30 ENCOUNTER — Ambulatory Visit (HOSPITAL_COMMUNITY): Payer: Medicaid Other | Admitting: *Deleted

## 2018-12-30 ENCOUNTER — Other Ambulatory Visit: Payer: Self-pay

## 2018-12-30 ENCOUNTER — Ambulatory Visit (HOSPITAL_COMMUNITY)
Admission: RE | Admit: 2018-12-30 | Discharge: 2018-12-30 | Disposition: A | Discharge status: Home / Self Care | Payer: Medicaid Other | Source: Ambulatory Visit | Attending: Obstetrics and Gynecology | Admitting: Obstetrics and Gynecology

## 2018-12-30 DIAGNOSIS — O0990 Supervision of high risk pregnancy, unspecified, unspecified trimester: Secondary | ICD-10-CM

## 2018-12-30 DIAGNOSIS — Z3A19 19 weeks gestation of pregnancy: Secondary | ICD-10-CM

## 2018-12-30 DIAGNOSIS — O26892 Other specified pregnancy related conditions, second trimester: Secondary | ICD-10-CM

## 2018-12-30 DIAGNOSIS — M329 Systemic lupus erythematosus, unspecified: Secondary | ICD-10-CM

## 2019-01-05 ENCOUNTER — Telehealth: Payer: Self-pay | Admitting: General Practice

## 2019-01-05 NOTE — Telephone Encounter (Signed)
Patient called and left message on nurse voicemail line stating she is transferring her care to another office. She states a release has been sent to [place] and she is checking on the status of the records being sent.

## 2019-05-02 ENCOUNTER — Other Ambulatory Visit: Payer: Self-pay

## 2019-05-02 ENCOUNTER — Encounter (HOSPITAL_COMMUNITY): Payer: Self-pay | Admitting: Emergency Medicine

## 2019-05-02 ENCOUNTER — Emergency Department (HOSPITAL_COMMUNITY)
Admission: EM | Admit: 2019-05-02 | Discharge: 2019-05-03 | Disposition: A | Discharge status: Other Acute Inpatient Hospital | Payer: Medicaid Other | Attending: Emergency Medicine | Admitting: Emergency Medicine

## 2019-05-02 DIAGNOSIS — R45851 Suicidal ideations: Secondary | ICD-10-CM | POA: Insufficient documentation

## 2019-05-02 DIAGNOSIS — Z008 Encounter for other general examination: Secondary | ICD-10-CM | POA: Diagnosis not present

## 2019-05-02 DIAGNOSIS — Z20828 Contact with and (suspected) exposure to other viral communicable diseases: Secondary | ICD-10-CM | POA: Insufficient documentation

## 2019-05-02 DIAGNOSIS — F332 Major depressive disorder, recurrent severe without psychotic features: Secondary | ICD-10-CM | POA: Diagnosis not present

## 2019-05-02 DIAGNOSIS — F4310 Post-traumatic stress disorder, unspecified: Secondary | ICD-10-CM | POA: Diagnosis not present

## 2019-05-02 DIAGNOSIS — F329 Major depressive disorder, single episode, unspecified: Secondary | ICD-10-CM | POA: Diagnosis present

## 2019-05-02 DIAGNOSIS — Z87891 Personal history of nicotine dependence: Secondary | ICD-10-CM | POA: Insufficient documentation

## 2019-05-02 DIAGNOSIS — F32A Depression, unspecified: Secondary | ICD-10-CM

## 2019-05-02 LAB — CBC WITH DIFFERENTIAL/PLATELET
Abs Immature Granulocytes: 0.28 10*3/uL — ABNORMAL HIGH (ref 0.00–0.07)
Basophils Absolute: 0 10*3/uL (ref 0.0–0.1)
Basophils Relative: 0 %
Eosinophils Absolute: 0 10*3/uL (ref 0.0–0.5)
Eosinophils Relative: 0 %
HCT: 37.8 % (ref 36.0–46.0)
Hemoglobin: 10.7 g/dL — ABNORMAL LOW (ref 12.0–15.0)
Immature Granulocytes: 3 %
Lymphocytes Relative: 11 %
Lymphs Abs: 1.1 10*3/uL (ref 0.7–4.0)
MCH: 23.5 pg — ABNORMAL LOW (ref 26.0–34.0)
MCHC: 28.3 g/dL — ABNORMAL LOW (ref 30.0–36.0)
MCV: 82.9 fL (ref 80.0–100.0)
Monocytes Absolute: 0.7 10*3/uL (ref 0.1–1.0)
Monocytes Relative: 7 %
Neutro Abs: 7.9 10*3/uL — ABNORMAL HIGH (ref 1.7–7.7)
Neutrophils Relative %: 79 %
Platelets: 444 10*3/uL — ABNORMAL HIGH (ref 150–400)
RBC: 4.56 MIL/uL (ref 3.87–5.11)
WBC: 10 10*3/uL (ref 4.0–10.5)
nRBC: 0 % (ref 0.0–0.2)

## 2019-05-02 LAB — I-STAT BETA HCG BLOOD, ED (MC, WL, AP ONLY): I-stat hCG, quantitative: >2000 m[IU]/mL — ABNORMAL HIGH (ref <5)

## 2019-05-02 LAB — RAPID URINE DRUG SCREEN, HOSP PERFORMED
Amphetamines: NOT DETECTED
Barbiturates: NOT DETECTED
Benzodiazepines: NOT DETECTED
Cocaine: NOT DETECTED
Opiates: NOT DETECTED
Tetrahydrocannabinol: POSITIVE — AB

## 2019-05-02 LAB — COMPREHENSIVE METABOLIC PANEL
ALT: 24 U/L (ref 0–44)
AST: 22 U/L (ref 15–41)
Albumin: 3.5 g/dL (ref 3.5–5.0)
Alkaline Phosphatase: 132 U/L — ABNORMAL HIGH (ref 38–126)
Anion gap: 8 (ref 5–15)
BUN: 5 mg/dL — ABNORMAL LOW (ref 6–20)
CO2: 24 mmol/L (ref 22–32)
Calcium: 8.9 mg/dL (ref 8.9–10.3)
Chloride: 108 mmol/L (ref 98–111)
Creatinine, Ser: 0.44 mg/dL (ref 0.44–1.00)
GFR calc Af Amer: >60 mL/min (ref >60)
GFR calc non Af Amer: >60 mL/min (ref >60)
Glucose, Bld: 79 mg/dL (ref 70–99)
Potassium: 3.6 mmol/L (ref 3.5–5.1)
Sodium: 140 mmol/L (ref 135–145)
Total Bilirubin: 0.6 mg/dL (ref 0.3–1.2)
Total Protein: 6.9 g/dL (ref 6.5–8.1)

## 2019-05-02 LAB — ETHANOL: Alcohol, Ethyl (B): <10 mg/dL (ref <10)

## 2019-05-02 LAB — RESPIRATORY PANEL BY RT PCR (FLU A&B, COVID)
Influenza A by PCR: NEGATIVE
Influenza B by PCR: NEGATIVE
SARS Coronavirus 2 by RT PCR: NEGATIVE

## 2019-05-02 MED ORDER — ALUM & MAG HYDROXIDE-SIMETH 200-200-20 MG/5ML PO SUSP: 30.0000 mL | Freq: Four times a day (QID) | ORAL | Status: DC | PRN

## 2019-05-02 MED ORDER — ACETAMINOPHEN 325 MG PO TABS: 650.0000 mg | ORAL_TABLET | ORAL | Status: DC | PRN

## 2019-05-02 NOTE — Progress Notes (Signed)
CSW spoke with patient after speaking with triage staff about patient regarding her needs. Patient reports she is [redacted] weeks pregnant and not sure what she will do when the baby is born. Patient reports she has been living house to house with friends in [place], she is originally from [place]. Patient reports her living situation is "fine" for now and declined to go to a shelter. CSW provided patient with resources for pregnant women and encouraged her to follow up. No other needs noted. CSW signing off, please consult again if any other needs arise.   [place], LCSW Transitions of Care Department [facility] ED [phone]

## 2019-05-02 NOTE — ED Notes (Signed)
Patient is to be admitted to [facility] by Dr. Daneil Dolin.  Attending Physician will be Dr. Weber Cooks.   Patient has been assigned to room 301, by [place].   Intake Paper Work has been signed and placed on patient chart.  ER staff is aware of the admission:   Call report to [phone].  Representative was [employer].    Pt can be transported any tie after 8 AM
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
## 2019-05-02 NOTE — ED Notes (Signed)
Pt flat effect with triage; pt verbalizes "I want to be admitted to the mental hospital." Pt denies SI/HI but verbalizes "I have no where to go; do I have to be suicidal to stay? I think I said 'can I have the legal injection?' last time is how they made me stay; just write that I said that."   Pt verbalizes school is closed, and her permanent address is in [place]; "have been going from friend's house to friend's house."   Pt 9 months pregnant; denies pregnancy compliant; "she moves all the time."

## 2019-05-02 NOTE — ED Notes (Signed)
Faced Voluntary form signed and faxed to [phone].

## 2019-05-02 NOTE — ED Notes (Signed)
Called back to [facility], patient will not be transported till tomorrow. Lanny Hurst, MHT is getting patient undressed.
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
## 2019-05-02 NOTE — BH Assessment (Signed)
Per Dr. Daneil Dolin, patient has been accepted to [facility] for inpatient treatment.  Pending bed assignment.

## 2019-05-02 NOTE — ED Notes (Signed)
Attempted to call [facility] for possible bed assignment. Directed to call back in 30 minutes for an update on bed status.

## 2019-05-02 NOTE — BH Assessment (Signed)
[facility] Assessment Progress Note   Case was staffed with Rankin NP who recommended a inpatient admission to assist with stabilization.    

## 2019-05-02 NOTE — BH Assessment (Signed)
Per Elmyra Ricks, patient accepted to [facility] room #301 for admission 05/03/2019 after 8am.  Nurse report [phone]. Patient to sign voluntary admission forms and those forms are to be faxed to [facility] @ [phone] prior to her transfer.

## 2019-05-03 ENCOUNTER — Other Ambulatory Visit: Payer: Self-pay

## 2019-05-03 ENCOUNTER — Inpatient Hospital Stay
Admit: 2019-05-03 | Discharge: 2019-05-05 | DRG: 998 | Disposition: A | Discharge status: Home / Self Care | Payer: Medicaid Other | Source: Intra-hospital | Attending: Psychiatry | Admitting: Psychiatry

## 2019-05-03 ENCOUNTER — Encounter: Payer: Self-pay | Admitting: Psychiatry

## 2019-05-03 DIAGNOSIS — Z87891 Personal history of nicotine dependence: Secondary | ICD-10-CM | POA: Diagnosis not present

## 2019-05-03 DIAGNOSIS — O26893 Other specified pregnancy related conditions, third trimester: Secondary | ICD-10-CM | POA: Diagnosis present

## 2019-05-03 DIAGNOSIS — Z888 Allergy status to other drugs, medicaments and biological substances status: Secondary | ICD-10-CM

## 2019-05-03 DIAGNOSIS — M329 Systemic lupus erythematosus, unspecified: Secondary | ICD-10-CM | POA: Diagnosis present

## 2019-05-03 DIAGNOSIS — D649 Anemia, unspecified: Secondary | ICD-10-CM | POA: Diagnosis present

## 2019-05-03 DIAGNOSIS — Z6791 Unspecified blood type, Rh negative: Secondary | ICD-10-CM | POA: Diagnosis not present

## 2019-05-03 DIAGNOSIS — F1210 Cannabis abuse, uncomplicated: Secondary | ICD-10-CM | POA: Diagnosis present

## 2019-05-03 DIAGNOSIS — F3181 Bipolar II disorder: Secondary | ICD-10-CM | POA: Diagnosis present

## 2019-05-03 DIAGNOSIS — R45851 Suicidal ideations: Secondary | ICD-10-CM | POA: Diagnosis present

## 2019-05-03 DIAGNOSIS — O99011 Anemia complicating pregnancy, first trimester: Secondary | ICD-10-CM | POA: Diagnosis present

## 2019-05-03 DIAGNOSIS — F329 Major depressive disorder, single episode, unspecified: Secondary | ICD-10-CM | POA: Diagnosis present

## 2019-05-03 DIAGNOSIS — F332 Major depressive disorder, recurrent severe without psychotic features: Secondary | ICD-10-CM | POA: Diagnosis present

## 2019-05-03 DIAGNOSIS — F4310 Post-traumatic stress disorder, unspecified: Secondary | ICD-10-CM | POA: Diagnosis present

## 2019-05-03 DIAGNOSIS — O9902 Anemia complicating childbirth: Secondary | ICD-10-CM | POA: Diagnosis present

## 2019-05-03 DIAGNOSIS — Z79899 Other long term (current) drug therapy: Secondary | ICD-10-CM | POA: Diagnosis not present

## 2019-05-03 DIAGNOSIS — O99344 Other mental disorders complicating childbirth: Secondary | ICD-10-CM | POA: Diagnosis present

## 2019-05-03 DIAGNOSIS — F319 Bipolar disorder, unspecified: Secondary | ICD-10-CM | POA: Diagnosis present

## 2019-05-03 DIAGNOSIS — O99892 Other specified diseases and conditions complicating childbirth: Secondary | ICD-10-CM | POA: Diagnosis present

## 2019-05-03 DIAGNOSIS — Z3490 Encounter for supervision of normal pregnancy, unspecified, unspecified trimester: Secondary | ICD-10-CM

## 2019-05-03 DIAGNOSIS — O99324 Drug use complicating childbirth: Secondary | ICD-10-CM | POA: Diagnosis present

## 2019-05-03 DIAGNOSIS — Z3A37 37 weeks gestation of pregnancy: Secondary | ICD-10-CM

## 2019-05-03 MED ORDER — MAGNESIUM HYDROXIDE 400 MG/5ML PO SUSP: 30.0000 mL | Freq: Every day | ORAL | Status: DC | PRN

## 2019-05-03 NOTE — ED Notes (Signed)
[facility] called to get patient to [facility].

## 2019-05-03 NOTE — Progress Notes (Signed)
Pt is socializing in the day room. She has made a phone call. Collier Bullock RN
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
## 2019-05-03 NOTE — Plan of Care (Signed)
New Admission  Problem: Coping: Goal: Ability to verbalize frustrations and anger appropriately will improve Outcome: Not Progressing   Problem: Safety: Goal: Periods of time without injury will increase Outcome: Not Progressing   Problem: Medication: Goal: Compliance with prescribed medication regimen will improve Outcome: Not Progressing   Problem: Self-Concept: Goal: Ability to disclose and discuss suicidal ideas will improve Outcome: Not Progressing   Problem: Education: Goal: Utilization of techniques to improve thought processes will improve Outcome: Not Progressing Goal: Knowledge of the prescribed therapeutic regimen will improve Outcome: Not Progressing

## 2019-05-03 NOTE — Progress Notes (Signed)
Recreation Therapy Notes  INPATIENT RECREATION THERAPY ASSESSMENT  Patient Details Name: Makenzee Choudhry MRN: [id number] DOB: 25-Dec-1995 Today's Date: 05/03/2019       Information Obtained From: Patient  Able to Participate in Assessment/Interview: Yes  Patient Presentation: Responsive  Reason for Admission (Per Patient): Active Symptoms  Patient Stressors:    Coping Skills:   (None)  Leisure Interests (2+):  Music - Listen  Frequency of Recreation/Participation: Monthly  Awareness of Community Resources:     [employer]:     Current Use:    If no, Barriers?:    Expressed Interest in [employer] Information:    [place] of Residence:  [place]  Patient Main Form of Transportation: Musician  Patient Strengths:  Considerate, Kind  Patient Identified Areas of Improvement:  Not be so sensitive  Patient Goal for Hospitalization:  To get a piece of mind and think for myself.  Current SI (including self-harm):  No  Current HI:  No  Current AVH: No  Staff Intervention Plan: Group Attendance, Collaborate with Interdisciplinary Treatment Team  Consent to Intern Participation: N/A  Kirsty Monjaraz 05/03/2019, 4:08 PM

## 2019-05-03 NOTE — Progress Notes (Signed)
Patient presents with sad affect but brightens on approach. Denies SI, HI, AVH. Endorses depression. Reports feels much better then when arrived on unit. Reports feeling hopeful and has a plan to care for herself and baby after discharge. Encouragement and support offered. No meds prescribed this shift. Pt remains safe on unit with q 15 min checks.

## 2019-05-03 NOTE — Progress Notes (Signed)
Recreation Therapy Notes    Date: 05/03/2019  Time: 1:00 pm   Location: Craft room  Behavioral response: Appropriate  Intervention Topic: Goals  Discussion/Intervention:  Group content on today was focused on goals. Patients described what goals are and how they define goals. Individuals expressed how they go about setting goals and reaching them. The group identified how important goals are and if they make short term goals to reach long term goals. Patients described how many goals they work on at a time and what affects them not reaching their goal. Individuals described how much time they put into planning and obtaining their goals. The group participated in the intervention "My Goal Board" and made personal goal boards to help them achieve their goal.  Clinical Observations/Feedback:  Patient came to group late and was focused on what peers and staff had to say about goals. Participant was social with peers and staff in group while participating in the intervention.   Gerad Cornelio LRT/CTRS         Tyjuan Demetro 05/03/2019 3:28 PM

## 2019-05-03 NOTE — [facility] Group Notes (Signed)
[place] [place] Group Notes:  (Nursing/MHT/Case Management/Adjunct)  Date:  05/03/2019  Time:  8:40 PM  Type of Therapy:  Group Therapy  Participation Level:  Active  Participation Quality:  Appropriate  Affect:  Appropriate  Cognitive:  Alert  Insight:  Appropriate  Engagement in Group:  Engaged  Modes of Intervention:  Support  Summary of Progress/Problems:  Michelle Cordova 05/03/2019, 8:40 PM
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
## 2019-05-03 NOTE — Plan of Care (Signed)
  Problem: Education: Goal: Knowledge of [facility] Education information/materials will improve Outcome: Progressing   Problem: Coping: Goal: Ability to verbalize frustrations and anger appropriately will improve Outcome: Progressing   Problem: Safety: Goal: Periods of time without injury will increase Outcome: Progressing   Problem: Medication: Goal: Compliance with prescribed medication regimen will improve Outcome: Progressing

## 2019-05-03 NOTE — BHH Suicide Risk Assessment (Signed)
[facility] Admission Suicide Risk Assessment   Nursing information obtained from:  Patient Demographic factors:  Unemployed Current Mental Status:  NA Loss Factors:  NA Historical Factors:  NA Risk Reduction Factors:  Positive coping skills or problem solving skills, Pregnancy  Total Time spent with patient: 1 hour Principal Problem: Bipolar depression (HCC) Diagnosis:  Principal Problem:   Bipolar depression (HCC) Active Problems:   Major depressive disorder, recurrent severe without psychotic features (HCC)   Bipolar 2 disorder (HCC)   Former cigarette smoker   Anemia in pregnancy, first trimester   Rh negative state in antepartum period   Pregnancy   Cannabis abuse  Subjective Data: Patient seen chart reviewed.  23 year old woman who was transferred to [place] from [place].  Patient reports major depressive symptoms with depressed mood hopelessness fatigue negative thinking about herself and suicidal ideation.  Patient does not describe any current plan to act on suicide and does not feel that she is likely to act on it in the hospital.  She is not showing any obvious signs of psychosis.  Continued Clinical Symptoms:  Alcohol Use Disorder Identification Test Final Score (AUDIT): 0 The "Alcohol Use Disorders Identification Test", Guidelines for Use in Primary Care, Second Edition.  World Science writer [facility]). Score between 0-7:  no or low risk or alcohol related problems. Score between 8-15:  moderate risk of alcohol related problems. Score between 16-19:  high risk of alcohol related problems. Score 20 or above:  warrants further diagnostic evaluation for alcohol dependence and treatment.   CLINICAL FACTORS:   Depression:   Hopelessness   Musculoskeletal: Strength & Muscle Tone: within normal limits Gait & Station: normal Patient leans: N/A  Psychiatric Specialty Exam: Physical Exam  Nursing note and vitals reviewed. Constitutional: She appears well-developed and  well-nourished.  HENT:  Head: Normocephalic and atraumatic.  Eyes: Pupils are equal, round, and reactive to light. Conjunctivae are normal.  Cardiovascular: Regular rhythm and normal heart sounds.  Respiratory: Effort normal. No respiratory distress.  GI: Soft.    Musculoskeletal:        General: Normal range of motion.     Cervical back: Normal range of motion.  Neurological: She is alert.  Skin: Skin is warm and dry.  Psychiatric: Her speech is delayed. She is slowed and withdrawn. Cognition and memory are normal. She expresses inappropriate judgment. She exhibits a depressed mood. She expresses suicidal ideation. She expresses no suicidal plans.    Review of Systems  Constitutional: Negative.   HENT: Negative.   Eyes: Negative.   Respiratory: Negative.   Cardiovascular: Negative.   Gastrointestinal: Negative.   Genitourinary:       Patient is approximately [redacted] weeks pregnant has no specific complaints  Musculoskeletal: Negative.   Skin: Negative.   Neurological: Negative.   Psychiatric/Behavioral: Positive for dysphoric mood, sleep disturbance and suicidal ideas. Negative for agitation, behavioral problems, confusion, decreased concentration, hallucinations and self-injury. The patient is not nervous/anxious and is not hyperactive.     Blood pressure 97/74, pulse 89, temperature 97.8 F (36.6 C), temperature source Tympanic, resp. rate 18, height 5\' 2"  (1.575 m), weight 59 kg, last menstrual period 08/15/2018, SpO2 100 %.Body mass index is 23.78 kg/m.  General Appearance: Casual  Eye Contact:  Minimal  Speech:  Slow  Volume:  Decreased  Mood:  Depressed and Dysphoric  Affect:  Congruent and Constricted  Thought Process:  Coherent  Orientation:  Full (Time, Place, and Person)  Thought Content:  Rumination  Suicidal Thoughts:  Yes.  without intent/plan  Homicidal Thoughts:  No  Memory:  Immediate;   Fair Recent;   Fair Remote;   Fair  Judgement:  Fair  Insight:  Fair    Psychomotor Activity:  Decreased  Concentration:  Concentration: Fair  Recall:  [employer] of Knowledge:  Fair  Language:  Fair  Akathisia:  No  Handed:  Right  AIMS (if indicated):     Assets:  Communication Skills Physical Health  ADL's:  Impaired  Cognition:  WNL  Sleep:         COGNITIVE FEATURES THAT CONTRIBUTE TO RISK:  Thought constriction (tunnel vision)    SUICIDE RISK:   Mild:  Suicidal ideation of limited frequency, intensity, duration, and specificity.  There are no identifiable plans, no associated intent, mild dysphoria and related symptoms, good self-control (both objective and subjective assessment), few other risk factors, and identifiable protective factors, including available and accessible social support.  PLAN OF CARE: Continue 15-minute precautions.  Treatment team will do full evaluation.  Engage patient in groups and activities.  I have spoken with her about recommendations about medication for treatment and she is currently refusing medication.  Ongoing reassessment prior to discharge  I certify that inpatient services furnished can reasonably be expected to improve the patient's condition.   Alethia Berthold, MD 05/03/2019, 2:57 PM
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
## 2019-05-03 NOTE — ED Notes (Signed)
attempted to call report to [facility]. Spoke with Jinny Blossom, she stated CN will call writer back.

## 2019-05-04 MED ORDER — PRENATAL MULTIVITAMIN CH
1.0000 | ORAL_TABLET | Freq: Every day | ORAL | Status: DC
  Filled 2019-05-04: qty 1

## 2019-05-04 NOTE — Progress Notes (Addendum)
Recreation Therapy Notes   Date: 05/04/2019  Time: 9:30    Location: Craft room  Behavioral response: Appropriate  Intervention Topic: Self-esteem  Discussion/Intervention:  Group content today was focused on self-esteem. Patient defined self-esteem and where it comes form. The group described reasons self-esteem is important. Individuals stated things that impact self-esteem and positive ways to improve self-esteem. The group participated in the intervention "Exploring Self-esteem" where patients were able to create something positive that makes them who they are.  Clinical Observations/Feedback:  Patient came to group late due to unknown reasons. She stated that negative self-esteem could mess up relationship with people. Participant was social with peers and staff in group while participating in the intervention.   Maxfield Gildersleeve LRT/CTRS          Sheetal Lyall 05/04/2019 11:04 AM

## 2019-05-04 NOTE — BHH Suicide Risk Assessment (Signed)
[facility] INPATIENT:  Family/Significant Other Suicide Prevention Education  Suicide Prevention Education:  Patient Refusal for Family/Significant Other Suicide Prevention Education: The patient Michelle Cordova has refused to provide written consent for family/significant other to be provided Family/Significant Other Suicide Prevention Education during admission and/or prior to discharge.  Physician notified.  SPE completed with pt, as pt refused to consent to family contact. SPI pamphlet provided to pt and pt was encouraged to share information with support network, ask questions, and talk about any concerns relating to SPE. Pt denies access to guns/firearms and verbalized understanding of information provided. Mobile Crisis information also provided to pt.    [place] MSW LCSW 05/04/2019, 9:52 AM
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
## 2019-05-04 NOTE — Plan of Care (Signed)
Patient is pleasant and cooperative on approach.Patient stated that she need to learn more coping skills and be more hopeful.Patient stated that she had good naps today.Denies SI,HI and AVH.Attended groups.Appetite and energy level good.Support and encouragement given.

## 2019-05-04 NOTE — Progress Notes (Signed)
Pt declines referral for follow up at this time stating that she is discharging to her boyfriends house, but plans to go back to the [place] area because that is where she wants to have her baby at. CSW will follow up with pt again closer to discharge regarding referrals for outpatient treatment.   Evalina Field, MSW, LCSW Clinical Social Work 05/04/2019 9:53 AM
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
## 2019-05-04 NOTE — BHH Group Notes (Signed)
LCSW Group Therapy Note  05/04/2019 1:00 PM  Type of Therapy/Topic:  Group Therapy:  Emotion Regulation  Participation Level:  Did Not Attend   Description of Group:   The purpose of this group is to assist patients in learning to regulate negative emotions and experience positive emotions. Patients will be guided to discuss ways in which they have been vulnerable to their negative emotions. These vulnerabilities will be juxtaposed with experiences of positive emotions or situations, and patients will be challenged to use positive emotions to combat negative ones. Special emphasis will be placed on coping with negative emotions in conflict situations, and patients will process healthy conflict resolution skills.  Therapeutic Goals: 1. Patient will identify two positive emotions or experiences to reflect on in order to balance out negative emotions 2. Patient will label two or more emotions that they find the most difficult to experience 3. Patient will demonstrate positive conflict resolution skills through discussion and/or role plays  Summary of Patient Progress: X  Therapeutic Modalities:   Cognitive Behavioral Therapy Feelings Identification Dialectical Behavioral Therapy  Tareq Dwan, MSW, LCSW 05/04/2019 11:11 AM  

## 2019-05-05 MED ORDER — PRENATAL MULTIVITAMIN CH: 1.0000 | ORAL_TABLET | Freq: Every day | ORAL | 2 refills | Status: DC

## 2019-05-05 MED ORDER — PRENATAL PLUS 27-1 MG PO TABS: 1.0000 | ORAL_TABLET | Freq: Every day | ORAL | 0 refills | Status: AC

## 2019-05-05 MED ORDER — PRENATAL PLUS 27-1 MG PO TABS
1.0000 | ORAL_TABLET | Freq: Every day | ORAL | Status: DC
  Filled 2019-05-05: qty 1
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
## 2019-05-05 NOTE — Progress Notes (Signed)
Recreation Therapy Notes  INPATIENT RECREATION TR PLAN  Patient Details Name: Michelle Cordova MRN: [id number] DOB: 08-20-1995 Today's Date: 05/05/2019  Rec Therapy Plan Is patient appropriate for Therapeutic Recreation?: Yes Treatment times per week: at least 3 Estimated Length of Stay: 5-7 days TR Treatment/Interventions: Group participation (Comment)  Discharge Criteria Pt will be discharged from therapy if:: Discharged Treatment plan/goals/alternatives discussed and agreed upon by:: Patient/family  Discharge Summary Short term goals set: Patient will identify 3 positive coping skills strategies to use post d/c within 5 recreation therapy group sessions Short term goals met: Adequate for discharge Progress toward goals comments: Groups attended Which groups?: Self-esteem, Goal setting Reason goals not met: N/A Therapeutic equipment acquired: N/A Reason patient discharged from therapy: Discharge from hospital Pt/family agrees with progress & goals achieved: Yes Date patient discharged from therapy: 05/05/19   Yuka Lallier 05/05/2019, 12:02 PM

## 2019-05-05 NOTE — BHH Group Notes (Signed)
LCSW Group Therapy Note  05/05/2019 2:11 PM  Type of Therapy/Topic:  Group Therapy:  Balance in Life  Participation Level:  Did Not Attend  Description of Group:    This group will address the concept of balance and how it feels and looks when one is unbalanced. Patients will be encouraged to process areas in their lives that are out of balance and identify reasons for remaining unbalanced. Facilitators will guide patients in utilizing problem-solving interventions to address and correct the stressor making their life unbalanced. Understanding and applying boundaries will be explored and addressed for obtaining and maintaining a balanced life. Patients will be encouraged to explore ways to assertively make their unbalanced needs known to significant others in their lives, using other group members and facilitator for support and feedback.  Therapeutic Goals: 1. Patient will identify two or more emotions or situations they have that consume much of in their lives. 2. Patient will identify signs/triggers that life has become out of balance:  3. Patient will identify two ways to set boundaries in order to achieve balance in their lives:  4. Patient will demonstrate ability to communicate their needs through discussion and/or role plays  Summary of Patient Progress: x     Therapeutic Modalities:   Cognitive Behavioral Therapy Solution-Focused Therapy Assertiveness Training  Evalina Field, MSW, LCSW Clinical Social Work 05/05/2019 2:11 PM

## 2019-05-05 NOTE — Tx Team (Signed)
Interdisciplinary Treatment and Diagnostic Plan Update  05/05/2019 Time of Session: 900am Michelle Cordova MRN: [id number]  Principal Diagnosis: Bipolar depression ([place])  Secondary Diagnoses: Principal Problem:   Bipolar depression ([place]) Active Problems:   Major depressive disorder, recurrent severe without psychotic features ([place])   Bipolar 2 disorder ([place])   Former cigarette smoker   Anemia in pregnancy, first trimester   Rh negative state in antepartum period   Pregnancy   Cannabis abuse   Bipolar 1 disorder ([place])   Current Medications:  Current Facility-Administered Medications  Medication Dose Route Frequency Provider Last Rate Last Admin  . magnesium hydroxide (MILK OF MAGNESIA) suspension 30 mL  30 mL Oral Daily PRN Caroline Sauger, NP      . prenatal multivitamin tablet 1 tablet  1 tablet Oral Q1200 Benjaman Kindler, MD       PTA Medications: Medications Prior to Admission  Medication Sig Dispense Refill Last Dose  . AMBULATORY NON FORMULARY MEDICATION 1 Device by Other route once a week. Blood Pressure Cuff Medium Monitored Regularly at home ICD 10:Z34.90 1 kit 0   . doxylamine, Sleep, (UNISOM) 25 MG tablet Take 0.5 tablets (12.5 mg total) by mouth at bedtime as needed. (Patient not taking: Reported on 12/30/2018) 30 tablet 0   . Doxylamine-Pyridoxine (DICLEGIS) 10-10 MG TBEC Take one at bedtime and work up to 2 at bedtime, one in the morning and one midafternoon as needed. (Patient not taking: Reported on 12/09/2018) 120 tablet 2   . famotidine (PEPCID) 20 MG tablet Take 1 tablet (20 mg total) by mouth 2 (two) times daily. (Patient not taking: Reported on 05/02/2019) 30 tablet 0   . metoCLOPramide (REGLAN) 10 MG tablet Take 1 tablet (10 mg total) by mouth every 6 (six) hours. (Patient not taking: Reported on 12/30/2018) 30 tablet 0   . ondansetron (ZOFRAN) 4 MG tablet Take 1 tablet (4 mg total) by mouth every 6 (six) hours. (Patient not taking: Reported on 12/30/2018)  12 tablet 0   . Prenatal Vit-Fe Phos-FA-Omega (VITAFOL GUMMIES) 3.33-0.333-34.8 MG CHEW Chew 3 each by mouth daily. (Patient not taking: Reported on 05/02/2019) 90 tablet 11   . pyridOXINE (PYRIDOXINE) 25 MG tablet Take 1 tablet (25 mg total) by mouth 3 (three) times daily. (Patient not taking: Reported on 12/30/2018) 60 tablet 0     Patient Stressors: Health problems Marital or family conflict Medication change or noncompliance  Patient Strengths: Ability for insight Active sense of humor Average or above average intelligence Capable of independent living Communication skills Supportive family/friends  Treatment Modalities: Medication Management, Group therapy, Case management,  1 to 1 session with clinician, Psychoeducation, Recreational therapy.   Physician Treatment Plan for Primary Diagnosis: Bipolar depression ([place]) Long Term Goal(s): Improvement in symptoms so as ready for discharge Improvement in symptoms so as ready for discharge   Short Term Goals: Ability to verbalize feelings will improve Ability to disclose and discuss suicidal ideas Ability to maintain clinical measurements within normal limits will improve  Medication Management: Evaluate patient's response, side effects, and tolerance of medication regimen.  Therapeutic Interventions: 1 to 1 sessions, Unit Group sessions and Medication administration.  Evaluation of Outcomes: Adequate for Discharge  Physician Treatment Plan for Secondary Diagnosis: Principal Problem:   Bipolar depression ([place]) Active Problems:   Major depressive disorder, recurrent severe without psychotic features ([place])   Bipolar 2 disorder ([place])   Former cigarette smoker   Anemia in pregnancy, first trimester   Rh negative state in antepartum period  Pregnancy   Cannabis abuse   Bipolar 1 disorder ([place])  Long Term Goal(s): Improvement in symptoms so as ready for discharge Improvement in symptoms so as ready for discharge   Short Term  Goals: Ability to verbalize feelings will improve Ability to disclose and discuss suicidal ideas Ability to maintain clinical measurements within normal limits will improve     Medication Management: Evaluate patient's response, side effects, and tolerance of medication regimen.  Therapeutic Interventions: 1 to 1 sessions, Unit Group sessions and Medication administration.  Evaluation of Outcomes: Adequate for Discharge   RN Treatment Plan for Primary Diagnosis: Bipolar depression ([place]) Long Term Goal(s): Knowledge of disease and therapeutic regimen to maintain health will improve  Short Term Goals: Ability to remain free from injury will improve, Ability to demonstrate self-control, Ability to verbalize feelings will improve and Ability to disclose and discuss suicidal ideas  Medication Management: RN will administer medications as ordered by provider, will assess and evaluate patient's response and provide education to patient for prescribed medication. RN will report any adverse and/or side effects to prescribing provider.  Therapeutic Interventions: 1 on 1 counseling sessions, Psychoeducation, Medication administration, Evaluate responses to treatment, Monitor vital signs and CBGs as ordered, Perform/monitor CIWA, COWS, AIMS and Fall Risk screenings as ordered, Perform wound care treatments as ordered.  Evaluation of Outcomes: Adequate for Discharge   LCSW Treatment Plan for Primary Diagnosis: Bipolar depression ([place]) Long Term Goal(s): Safe transition to appropriate next level of care at discharge, Engage patient in therapeutic group addressing interpersonal concerns.  Short Term Goals: Engage patient in aftercare planning with referrals and resources, Increase emotional regulation and Increase skills for wellness and recovery  Therapeutic Interventions: Assess for all discharge needs, 1 to 1 time with Social worker, Explore available resources and support systems, Assess for  adequacy in community support network, Educate family and significant other(s) on suicide prevention, Complete Psychosocial Assessment, Interpersonal group therapy.  Evaluation of Outcomes: Adequate for Discharge   Progress in Treatment: Attending groups: No. Participating in groups: No. Taking medication as prescribed: No. Toleration medication: No. Family/Significant other contact made: No, will contact:  when consent given Patient understands diagnosis: Yes. Discussing patient identified problems/goals with staff: Yes. Medical problems stabilized or resolved: Yes. Denies suicidal/homicidal ideation: Yes. Issues/concerns per patient self-inventory: No. Other: N/A  New problem(s) identified: No, Describe:  none  New Short Term/Long Term Goal(s): Detox, elimination of AVH/symptoms of psychosis, medication management for mood stabilization; elimination of SI thoughts; development of comprehensive mental wellness/sobriety plan.   Patient Goals:  "Figure out a plan and get coping skills"  Discharge Plan or Barriers: SPE pamphlet, Mobile Crisis information, and AA/NA information provided to patient for additional community support and resources.  Reason for Continuation of Hospitalization: none  Estimated Length of Stay: Today 05/05/2019  Attendees: Patient: Michelle Cordova 05/05/2019 9:08 AM  Physician: Dr Danella Sensing 05/05/2019 9:08 AM  Nursing: Polly Cobia RN 05/05/2019 9:08 AM  RN Care Manager: 05/05/2019 9:08 AM  Social Worker: Evalina Field LCSW 05/05/2019 9:08 AM  Recreational Therapist:  05/05/2019 9:08 AM  Other: Assunta Curtis LCSW 05/05/2019 9:08 AM  Other:  05/05/2019 9:08 AM  Other: 05/05/2019 9:08 AM    Scribe for Treatment Team: Mariann Laster Helton Oleson, LCSW 05/05/2019 9:08 AM

## 2019-05-05 NOTE — Progress Notes (Signed)
  [facility] Adult Case Management Discharge Plan :  Will you be returning to the same living situation after discharge:  Yes,  with bf At discharge, do you have transportation home?: Yes,  bf will pick pt up Do you have the ability to pay for your medications: Yes,  medicaid  Release of information consent forms completed and in the chart;    Patient to Follow up at: Follow-up Information    Patient Declined Follow up.   Why: Please review the list of resources provided to you of local community mental health resources. Thank You!          Next level of care provider has access to [place] and Suicide Prevention discussed: Yes,  SPE completed with pt as pt declined collateral contact  Have you used any form of tobacco in the last 30 days? (Cigarettes, Smokeless Tobacco, Cigars, and/or Pipes): No  Has patient been referred to the Quitline?: N/A patient is not a smoker  Patient has been referred for addiction treatment: [place], LCSW 05/05/2019, 9:14 AM
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
## 2019-05-05 NOTE — Progress Notes (Signed)
Recreation Therapy Notes  Date: 05/05/2019  Time: 9:30 am   Location: Craft room   Behavioral response: N/A   Intervention Topic: Time Management   Discussion/Intervention: Patient did not attend group.   Clinical Observations/Feedback:  Patient did not attend group.   Waymond Meador LRT/CTRS        Deberah Adolf 05/05/2019 11:55 AM

## 2019-05-05 NOTE — Progress Notes (Signed)
D- Patient alert and oriented. Patient presents in a pleasant mood on assessment reporting that she slept fair last night and had no complaints to voice to this Probation officer. Patient denies SI, HI, AVH, and pain at this time. Patient also denies any signs/symptoms of depression and anxiety. Patient's goal for today is "staying positive", in which she will "remember coping skills for stress", in order to accomplish her goals.  A- Support and encouragement provided.  Routine safety checks conducted every 15 minutes.  Patient informed to notify staff with problems or concerns.  R- Patient contracts for safety at this time. Patient compliant with treatment plan. Patient receptive, calm, and cooperative. Patient interacts well with others on the unit. Patient remains safe at this time.

## 2019-05-05 NOTE — BHH Suicide Risk Assessment (Signed)
[facility] Discharge Suicide Risk Assessment   Principal Problem: Bipolar depression [facility]) Discharge Diagnoses: Principal Problem:   Bipolar depression ([place]) Active Problems:   Major depressive disorder, recurrent severe without psychotic features ([place])   Bipolar 2 disorder ([place])   Former cigarette smoker   Anemia in pregnancy, first trimester   Rh negative state in antepartum period   Pregnancy   Cannabis abuse   Bipolar 1 disorder ([place])   Suicide Risk:  Minimal: No identifiable suicidal ideation.  Patients presenting with no risk factors but with morbid ruminations; may be classified as minimal risk based on the severity of the depressive symptoms  Suicide risk assessment: Prior to the date of discharge on 05/05/19 Risk of imminent suicide considered minimal based on patient no longer reported depressed mood, denies SI, actively participating in groups, future oriented, has a place to live and accepted resources for outpatient care.  Patient denies access to guns.  Follow-up Information    Patient Declined Follow up.   Why: Please review the list of resources provided to you of local community mental health resources. Thank You!          Plan Of Care/Follow-up recommendations:  Other:  outpatient mental health care.  Larita Fife, MD 05/05/2019, 12:59 PM

## 2019-05-05 NOTE — BHH Counselor (Signed)
CSW provided the patient's nurse the following to be provided at discharge: -homeless shelters -mother/baby resources -family planning -food resources -low cost health clinics  Resources are for [place] as patient indicated that she plans on going from [place] to [place].  Assunta Curtis, MSW, LCSW 05/05/2019 2:25 PM

## 2019-05-05 NOTE — Progress Notes (Signed)
Patient alert and oriented x 4, affect is flat but she brightens upon approach. Patient was receptive to staff she denies SI/HI/AVH   OB GYN was on floor they checked baby FHT  It was 151 no distress noted, patient was noted interacting appropriately with peers and staff, she appears less anxious, 15 minutes safety checks maintained will continue to monitor.

## 2019-05-05 NOTE — Plan of Care (Signed)
  Problem: Coping Skills Goal: STG - Patient will identify 3 positive coping skills strategies to use post d/c within 5 recreation therapy group sessions Description: STG - Patient will identify 3 positive coping skills strategies to use post d/c within 5 recreation therapy group sessions 05/05/2019 1202 by Ernest Haber, LRT Outcome: Adequate for Discharge 05/05/2019 1202 by Ernest Haber, LRT Outcome: Adequate for Discharge

## 2019-05-05 NOTE — Progress Notes (Signed)
Patient ID: Michelle Cordova, female   DOB: July 21, 1995, 23 y.o.   MRN: [id number]   Discharge Note:  Patient denies SI/HI/AVH at this time. Discharge instructions, AVS, prescriptions, and transition record gone over with patient. Patient agrees to comply with medication management, follow-up visit, and outpatient therapy. Patient belongings returned to patient. Patient questions and concerns addressed and answered. Patient ambulatory off unit. Patient discharged to home with boyfriend.

## 2019-08-15 ENCOUNTER — Ambulatory Visit: Payer: Medicaid Other | Attending: Internal Medicine

## 2019-08-15 ENCOUNTER — Other Ambulatory Visit: Payer: Medicaid Other

## 2019-08-15 DIAGNOSIS — Z20822 Contact with and (suspected) exposure to covid-19: Secondary | ICD-10-CM

## 2019-08-16 LAB — NOVEL CORONAVIRUS, NAA: SARS-CoV-2, NAA: NOT DETECTED

## 2019-08-16 LAB — SARS-COV-2, NAA 2 DAY TAT

## 2020-01-30 ENCOUNTER — Ambulatory Visit: Payer: Medicaid Other | Admitting: Obstetrics & Gynecology

## 2020-08-17 IMAGING — US US MFM OB DETAIL +14 WK
1 series · 13 of 28 positions shown · non-contrast
Comparison: none

[Series 1: us mfm ob detail +14 wk · 13 of 108 slices shown]
[im 4/108]
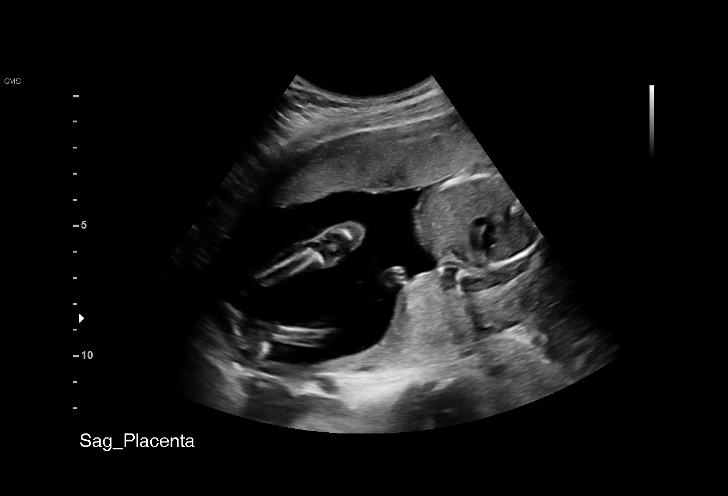
[im 12/108]
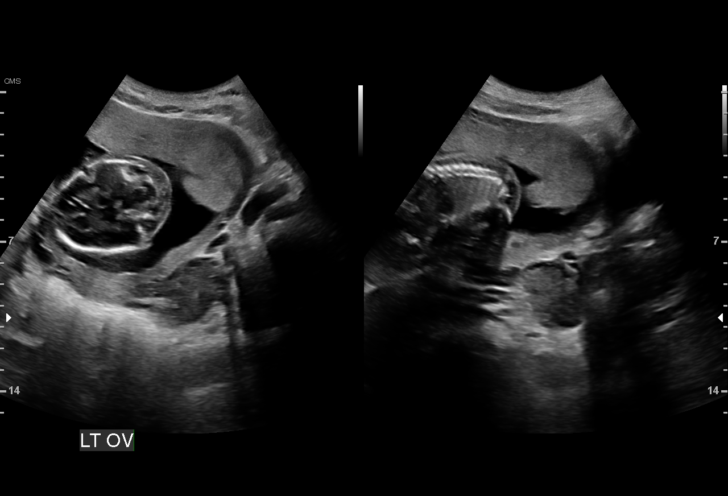
[im 20/108]
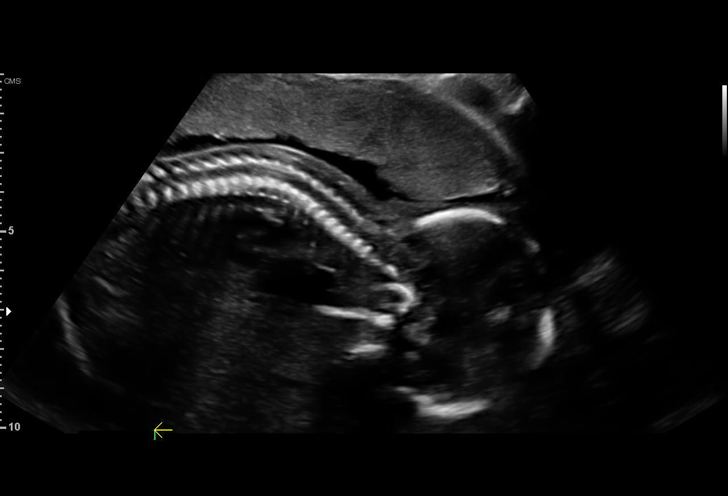
[im 28/108]
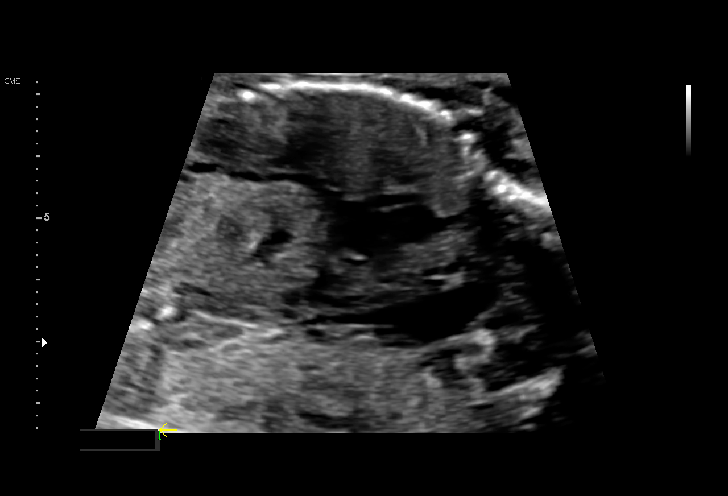
[im 36/108]
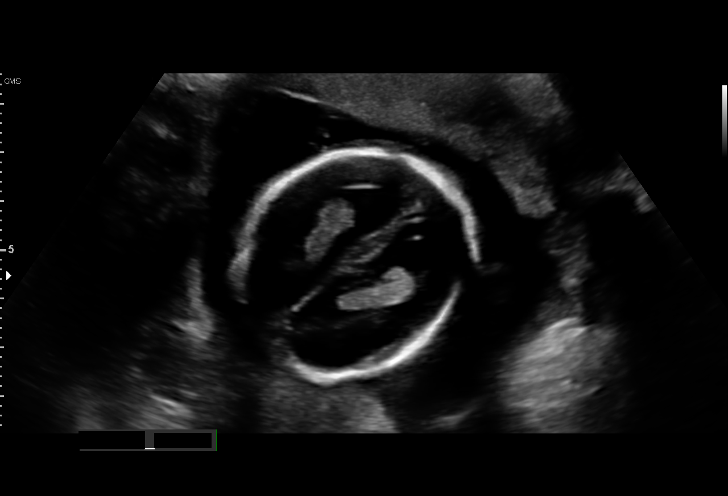
[im 44/108]
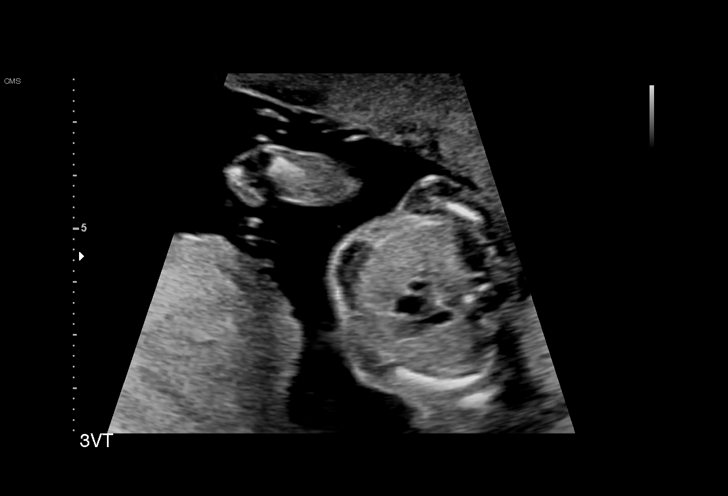
[im 56/108]
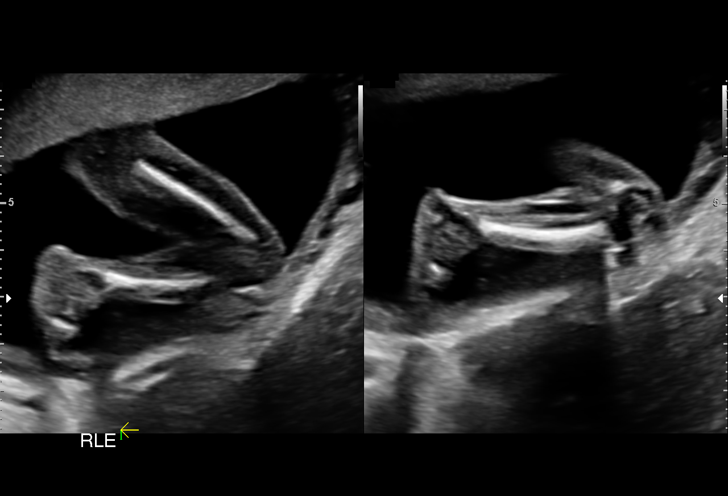
[im 64/108]
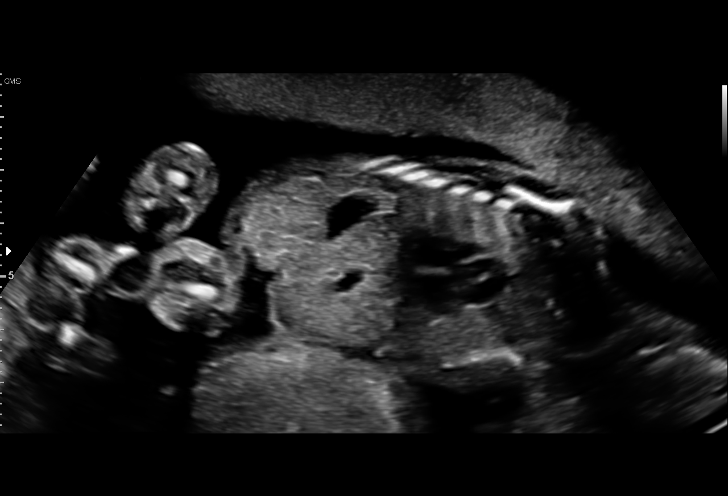
[im 72/108]
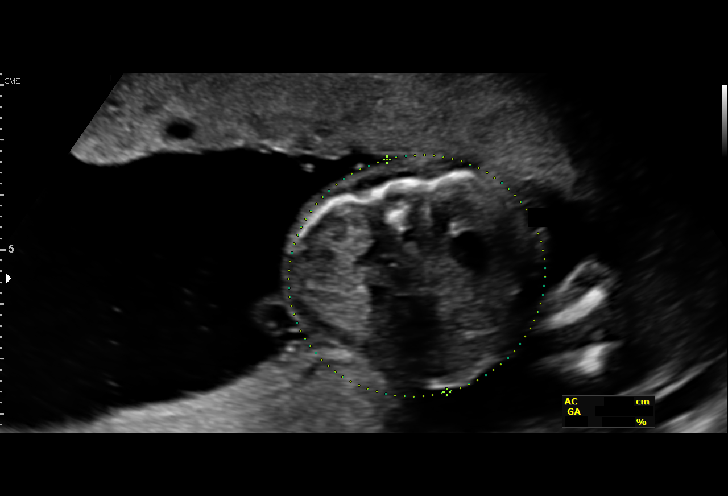
[im 80/108]
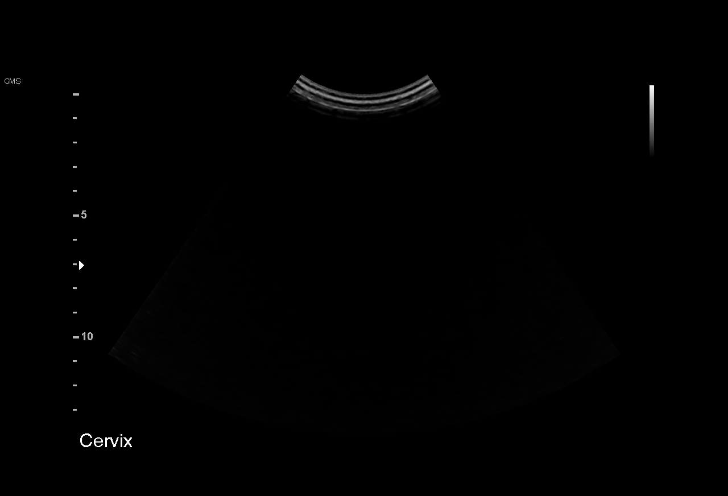
[im 88/108]
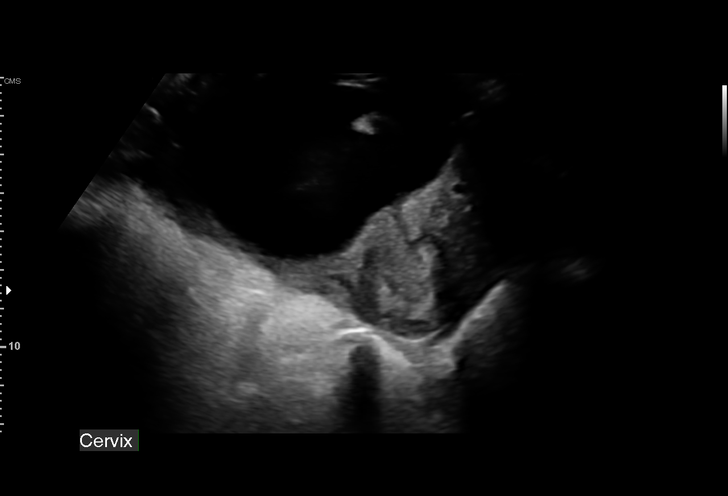
[im 96/108]
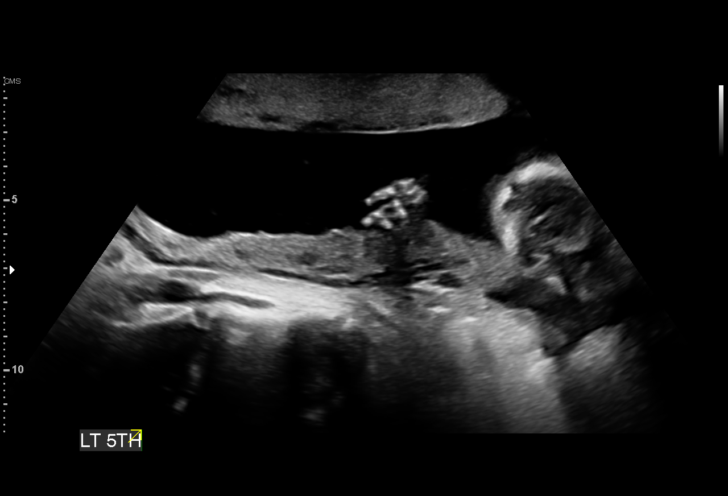
[im 104/108]
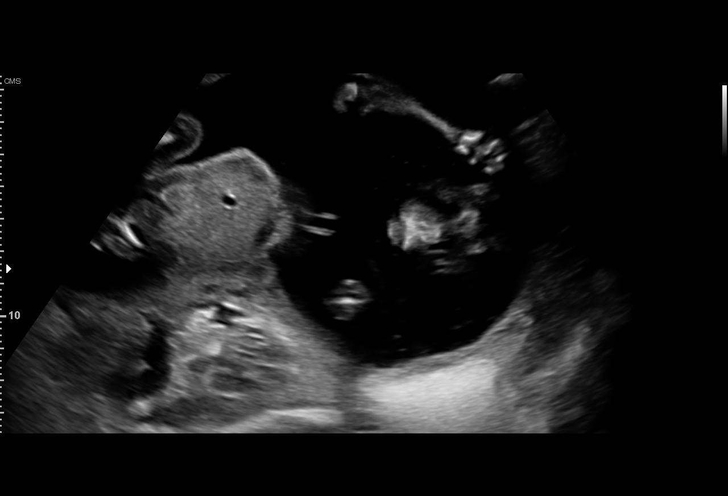

[13 of 28 positions shown; findings below may reference images not displayed]

[address]

 ----------------------------------------------------------------------

 ----------------------------------------------------------------------
Indications

  19 weeks gestation of pregnancy
  Antenatal screening for malformations
  Systemic lupus complicating pregnancy,         O26.892,
  second trimester
 ----------------------------------------------------------------------
Fetal Evaluation

 Num Of Fetuses:         1
 Fetal Heart Rate(bpm):  151
 Cardiac Activity:       Observed
 Presentation:           Cephalic
 Placenta:               Anterior
 P. Cord Insertion:      Visualized

 Amniotic Fluid
 AFI FV:      Within normal limits

                             Largest Pocket(cm)

Biometry

 BPD:      44.2  mm     G. Age:  19w 3d         41  %    CI:        71.32   %    70 - 86
                                                         FL/HC:      17.5   %    16.8 -
 HC:      166.7  mm     G. Age:  19w 2d         32  %    HC/AC:      1.19        1.09 -
 AC:      139.5  mm     G. Age:  19w 2d         37  %    FL/BPD:     66.1   %
 FL:       29.2  mm     G. Age:  19w 0d         23  %    FL/AC:      20.9   %    20 - 24
 HUM:      29.6  mm     G. Age:  19w 5d         55  %
 NFT:       2.4  mm
 CM:        5.9  mm

 Est. FW:     279  gm    0 lb 10 oz      25  %
OB History

 Gravidity:    1         Term:   0        Prem:   0        SAB:   0
 TOP:          0       Ectopic:  0        Living: 0
Gestational Age

 LMP:           19w 4d        Date:  08/15/18                 EDD:   05/22/19
 U/S Today:     19w 2d                                        EDD:   05/24/19
 Best:          19w 4d     Det. By:  LMP  (08/15/18)          EDD:   05/22/19
Anatomy

 Cranium:               Appears normal         LVOT:                   Appears normal
 Cavum:                 Appears normal         Aortic Arch:            Appears normal
 Ventricles:            Appears normal         Ductal Arch:            Appears normal
 Choroid Plexus:        Appears normal         Diaphragm:              Appears normal
 Cerebellum:            Appears normal         Stomach:                Appears normal, left
                                                                       sided
 Posterior Fossa:       Appears normal         Abdomen:                Appears normal
 Nuchal Fold:           Appears normal         Abdominal Wall:         Appears nml (cord
                                                                       insert, abd wall)
 Face:                  Appears normal         Cord Vessels:           Appears normal (3
                        (orbits and profile)                           vessel cord)
 Lips:                  Appears normal         Kidneys:                Appear normal
 Palate:                Not well visualized    Bladder:                Appears normal
 Thoracic:              Appears normal         Spine:                  Appears normal
 Heart:                 Echogenic focus        Upper Extremities:      Appears normal
                        in LV
 RVOT:                  Appears normal         Lower Extremities:      Appears normal

 Other:  Heels and 5th digit visualized. Nasal bone visualized. Technically
         difficult due to fetal position.
Cervix Uterus Adnexa

 Cervix
 Length:            3.4  cm.
 Normal appearance by transabdominal scan.

 Uterus
 No abnormality visualized.

 Left Ovary
 Within normal limits.

 Right Ovary
 Within normal limits.

 Cul De Sac
 No free fluid seen.

 Adnexa
 No abnormality visualized.
Impression

 We performed fetal anatomy scan. An echogenic intracardiac
 focus is seen. No other makers of aneuploidies or fetal
 structural defects are seen. Fetal biometry is consistent with
 her previously-established dates. Amniotic fluid is normal and
 good fetal activity is seen.
 I informed the patient that given that she had [facility] for fetal
 aneuploidies on cell-free fetal DNA screening, finding of
 echogenic intracardiac focus should be considered a normal
 variant and that the risk of trisomy 21 is not increased. I also
 reassured that echogenic focus does not increase the risk of
 cardiac defects. I also informed her that only amniocentesis
 will give a defintive result on the fetal karyotype.
 Patient opted not to have amniocentesis.
 Patient reports she had skin biopsy 2 years ago for
 suspected lupus (?). She also had a consultation with a
 rheumatologist and no definitive diagnosis of SLE was made.
 She will be transferring her prenatal care and deliver in
 [place][FAD[place]].
Recommendations

 -Follow-up scans as clinically indicated.
                 Heberling, Mladizupe
